# Patient Record
Sex: Male | Born: 1995 | Race: Black or African American | Hispanic: No | Marital: Single
Health system: Southern US, Community
[De-identification: ages and names within clinical notes are randomized; demographics above are authoritative.]

## PROBLEM LIST (undated history)

## (undated) DIAGNOSIS — S43006A Unspecified dislocation of unspecified shoulder joint, initial encounter: Secondary | ICD-10-CM

## (undated) DIAGNOSIS — G43909 Migraine, unspecified, not intractable, without status migrainosus: Secondary | ICD-10-CM

---
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
## 2017-06-09 ENCOUNTER — Emergency Department (HOSPITAL_COMMUNITY)
Admission: EM | Admit: 2017-06-09 | Discharge: 2017-06-09 | Disposition: A | Discharge status: Home / Self Care | Payer: Medicaid Other | Attending: Emergency Medicine | Admitting: Emergency Medicine

## 2017-06-09 ENCOUNTER — Encounter (HOSPITAL_COMMUNITY): Payer: Self-pay

## 2017-06-09 DIAGNOSIS — Z5321 Procedure and treatment not carried out due to patient leaving prior to being seen by health care provider: Secondary | ICD-10-CM | POA: Diagnosis not present

## 2017-06-09 DIAGNOSIS — R51 Headache: Secondary | ICD-10-CM | POA: Diagnosis present

## 2017-06-09 DIAGNOSIS — R109 Unspecified abdominal pain: Secondary | ICD-10-CM | POA: Insufficient documentation

## 2017-06-09 LAB — COMPREHENSIVE METABOLIC PANEL
ALBUMIN: 3.9 g/dL (ref 3.5–5.0)
ALT: 15 U/L — ABNORMAL LOW (ref 17–63)
ANION GAP: 6 (ref 5–15)
AST: 23 U/L (ref 15–41)
Alkaline Phosphatase: 70 U/L (ref 38–126)
BUN: 8 mg/dL (ref 6–20)
CALCIUM: 8.8 mg/dL — AB (ref 8.9–10.3)
CO2: 28 mmol/L (ref 22–32)
Chloride: 103 mmol/L (ref 101–111)
Creatinine, Ser: 1.04 mg/dL (ref 0.61–1.24)
GFR calc Af Amer: >60 mL/min (ref >60)
GLUCOSE: 90 mg/dL (ref 65–99)
Potassium: 3.8 mmol/L (ref 3.5–5.1)
SODIUM: 137 mmol/L (ref 135–145)
TOTAL PROTEIN: 6.9 g/dL (ref 6.5–8.1)
Total Bilirubin: 0.8 mg/dL (ref 0.3–1.2)

## 2017-06-09 LAB — URINALYSIS, ROUTINE W REFLEX MICROSCOPIC
Bilirubin Urine: NEGATIVE
GLUCOSE, UA: NEGATIVE mg/dL
HGB URINE DIPSTICK: NEGATIVE
Ketones, ur: NEGATIVE mg/dL
NITRITE: NEGATIVE
PH: 8 (ref 5.0–8.0)
Protein, ur: NEGATIVE mg/dL
SPECIFIC GRAVITY, URINE: 1.015 (ref 1.005–1.030)
Squamous Epithelial / LPF: NONE SEEN

## 2017-06-09 LAB — CBC
HCT: 44 % (ref 39.0–52.0)
Hemoglobin: 14.8 g/dL (ref 13.0–17.0)
MCH: 31.9 pg (ref 26.0–34.0)
MCHC: 33.6 g/dL (ref 30.0–36.0)
MCV: 94.8 fL (ref 78.0–100.0)
Platelets: 182 10*3/uL (ref 150–400)
RBC: 4.64 MIL/uL (ref 4.22–5.81)
RDW: 12.6 % (ref 11.5–15.5)
WBC: 4.3 10*3/uL (ref 4.0–10.5)

## 2017-06-09 LAB — LIPASE, BLOOD: Lipase: 25 U/L (ref 11–51)

## 2017-06-09 NOTE — ED Notes (Signed)
No answer in waiting.  

## 2017-06-09 NOTE — ED Triage Notes (Signed)
Pt states he started having severe migraine today with sharp abdominal pains; pt states he has been nausea but did not vomit; pt has hx of migraines; pt a&ox 4 on arrival.-Monique,RN

## 2017-10-10 ENCOUNTER — Encounter: Payer: Self-pay | Admitting: *Deleted

## 2017-10-10 ENCOUNTER — Emergency Department
Admission: EM | Admit: 2017-10-10 | Discharge: 2017-10-11 | Disposition: A | Discharge status: Home / Self Care | Payer: Medicaid Other | Attending: Emergency Medicine | Admitting: Emergency Medicine

## 2017-10-10 ENCOUNTER — Other Ambulatory Visit: Payer: Self-pay

## 2017-10-10 DIAGNOSIS — Z7722 Contact with and (suspected) exposure to environmental tobacco smoke (acute) (chronic): Secondary | ICD-10-CM | POA: Insufficient documentation

## 2017-10-10 DIAGNOSIS — R55 Syncope and collapse: Secondary | ICD-10-CM | POA: Insufficient documentation

## 2017-10-10 DIAGNOSIS — Z79899 Other long term (current) drug therapy: Secondary | ICD-10-CM | POA: Insufficient documentation

## 2017-10-10 DIAGNOSIS — E162 Hypoglycemia, unspecified: Secondary | ICD-10-CM | POA: Insufficient documentation

## 2017-10-10 DIAGNOSIS — E860 Dehydration: Secondary | ICD-10-CM | POA: Insufficient documentation

## 2017-10-10 HISTORY — DX: Unspecified dislocation of unspecified shoulder joint, initial encounter: S43.006A

## 2017-10-10 LAB — GLUCOSE, CAPILLARY: GLUCOSE-CAPILLARY: 67 mg/dL (ref 65–99)

## 2017-10-10 MED ORDER — SODIUM CHLORIDE 0.9 % IV BOLUS (SEPSIS)
1000.0000 mL | Freq: Once | INTRAVENOUS | Status: AC
  Administered 2017-10-10: 1000 mL via INTRAVENOUS

## 2017-10-10 NOTE — ED Provider Notes (Signed)
[facility] Emergency Department Provider Note   ____________________________________________   First MD Initiated Contact with Patient 10/10/17 2326     (approximate)  I have reviewed the triage vital signs and the nursing notes.   HISTORY  Chief Complaint Loss of Consciousness    HPI Troy Marshall is a 22 y.o. male who comes into the hospital today with a syncopal episode.  The patient states that he worked his first day at a new job today.  The patient did not have a ride home so he walked for 6 hours from his new job to his home this evening.  When he arrived home he told his significant other that his entire body was aching and he passed out.  He states that he did not have a ride home from work.  The patient also has not eaten anything all day.  He reports that he was rushing to get to work so he did not eat before he left home when he did not have the funds to purchase anything to eat while at work.  The patient states that he did drink 2 cups of water while at work.  The patient is unsure how long he was unconscious but his mom states that he was in and out.  He states that he does not believe he hit his head.  He states he has pain from the waist down and rates his pain a 10 out of 10 in intensity.  The patient is here today for evaluation.   Past Medical History:  Diagnosis Date  . Migraines   . Shoulder dislocation    left    Patient Active Problem List   Diagnosis Date Noted  . Migraine with aura and with status migrainosus, not intractable 08/09/2015  . Migraine without aura and without status migrainosus, not intractable 08/07/2015  . Complicated migraine 08/07/2015  . Cephalalgia 07/31/2015  . Abnormal neurological exam 07/31/2015  . Acne 05/25/2013    History reviewed. No pertinent surgical history.  Prior to Admission medications   Medication Sig Start Date End Date Taking? Authorizing Provider  topiramate (TOPAMAX) 25 MG tablet  Take 2 tablets at nighttime 11/27/15   Deetta Perla, MD    Allergies Patient has no known allergies.  Family History  Problem Relation Age of Onset  . Hypertension Other     Social History Social History   Tobacco Use  . Smoking status: Passive Smoke Exposure - Never Smoker  . Smokeless tobacco: Never Used  Substance Use Topics  . Alcohol use: No    Alcohol/week: 0.0 oz  . Drug use: No    Review of Systems  Constitutional: No fever/chills Eyes: No visual changes. ENT: No sore throat. Cardiovascular: Denies chest pain. Respiratory: Denies shortness of breath. Gastrointestinal: No abdominal pain.  No nausea, no vomiting.  No diarrhea.  No constipation. Genitourinary: Negative for dysuria. Musculoskeletal: Leg cramps Skin: Negative for rash. Neurological: Syncope   ____________________________________________   PHYSICAL EXAM:  VITAL SIGNS: ED Triage Vitals  Enc Vitals Group     BP 10/10/17 2303 118/76     Pulse Rate 10/10/17 2303 84     Resp 10/10/17 2303 16     Temp 10/10/17 2303 98.4 F (36.9 C)     Temp Source 10/10/17 2303 Oral     SpO2 10/10/17 2303 97 %     Weight 10/10/17 2304 123 lb 3.8 oz (55.9 kg)     Height 10/10/17 2304 5\' 6"  (1.676  m)     Head Circumference --      Peak Flow --      Pain Score 10/10/17 2302 10     Pain Loc --      Pain Edu? --      Excl. in GC? --     Constitutional: Alert and oriented. Well appearing and in moderate distress. Eyes: Conjunctivae are normal. PERRL. EOMI. Head: Atraumatic. Nose: No congestion/rhinnorhea. Mouth/Throat: Mucous membranes are moist.  Oropharynx non-erythematous. Neck: No cervical spine tenderness to palpation Cardiovascular: Normal rate, regular rhythm. Grossly normal heart sounds.  Good peripheral circulation. Respiratory: Normal respiratory effort.  No retractions. Lungs CTAB. Gastrointestinal: Soft and nontender. No distention. Positive bowel sounds Musculoskeletal: No lower extremity  tenderness nor edema.   Neurologic:  Normal speech and language.  Cranial nerves II through XII are grossly intact with no focal motor neuro deficit Skin:  Skin is warm, dry and intact.  Psychiatric: Mood and affect are normal.   ____________________________________________   LABS (all labs ordered are listed, but only abnormal results are displayed)  Labs Reviewed  URINALYSIS, COMPLETE (UACMP) WITH MICROSCOPIC - Abnormal; Notable for the following components:      Result Value   Color, Urine YELLOW (*)    APPearance CLOUDY (*)    Ketones, ur 20 (*)    Bacteria, UA RARE (*)    Squamous Epithelial / LPF 0-5 (*)    All other components within normal limits  BASIC METABOLIC PANEL  CBC  CK  TROPONIN I  GLUCOSE, CAPILLARY  CBG MONITORING, ED   ____________________________________________  EKG  ED ECG REPORT I, Rebecka Apley, the attending physician, personally viewed and interpreted this ECG.   Date: 10/10/2017  EKG Time: 2304  Rate: 85  Rhythm: normal sinus rhythm  Axis: normal  Intervals:none  ST&T Change: none  ____________________________________________  RADIOLOGY  ED MD interpretation:  none  Official radiology report(s): No results found.  ____________________________________________   PROCEDURES  Procedure(s) performed: None  Procedures  Critical Care performed: No  ____________________________________________   INITIAL IMPRESSION / ASSESSMENT AND PLAN / ED COURSE  As part of my medical decision making, I reviewed the following data within the electronic MEDICAL RECORD NUMBER Notes from prior ED visits and [place] Controlled Substance Database   This is a 22 year old male who comes into the hospital today after a syncopal episode.  The patient had not eaten all day and then walked 6 hours home from work.  My differential diagnosis includes dehydration, hypoglycemia, orthostatic syncope, exhaustion.  I did check some blood work to include a CBC  BMP glucose troponin and CK and urinalysis.  The patient's urine does show 20 ketones.  His CBC is unremarkable and his CK and troponin are both negative.  At this time I am still waiting for the results of the BMP.  I did give the patient a liter of normal saline.  The patient's glucose when he arrived was 67.  We did have him eat and drink here in the emergency department.     Patient's blood work is unremarkable.  He will be discharged home and encouraged to make sure he is eating and drinking adequately on a daily basis.  The patient should follow-up with the acute care clinic. ____________________________________________   FINAL CLINICAL IMPRESSION(S) / ED DIAGNOSES  Final diagnoses:  Syncope, unspecified syncope type  Hypoglycemia  Dehydration     ED Discharge Orders    None  Note:  This document was prepared using Dragon voice recognition software and may include unintentional dictation errors.    Rebecka ApleyWebster, Allison P, MD 10/11/17 [phone]

## 2017-10-10 NOTE — ED Triage Notes (Signed)
Per EMS report, patient worked at [employer] in [place]. Patient's fiancee told EMS that the patient then "collasped  And passed out, " for an unknown duration. Patient is alert and oriented upon arrival. Patient told EMS that he last ate last night, but tried to drink water today. Patient c/o bilateral calf/ankle pain.

## 2017-10-11 LAB — URINALYSIS, COMPLETE (UACMP) WITH MICROSCOPIC
Bilirubin Urine: NEGATIVE
Glucose, UA: NEGATIVE mg/dL
Hgb urine dipstick: NEGATIVE
Ketones, ur: 20 mg/dL — AB
LEUKOCYTES UA: NEGATIVE
Nitrite: NEGATIVE
PH: 7 (ref 5.0–8.0)
Protein, ur: NEGATIVE mg/dL
SPECIFIC GRAVITY, URINE: 1.014 (ref 1.005–1.030)

## 2017-10-11 LAB — TROPONIN I

## 2017-10-11 LAB — BASIC METABOLIC PANEL
Anion gap: 12 (ref 5–15)
BUN: 12 mg/dL (ref 6–20)
CHLORIDE: 103 mmol/L (ref 101–111)
CO2: 22 mmol/L (ref 22–32)
CREATININE: 0.92 mg/dL (ref 0.61–1.24)
Calcium: 9 mg/dL (ref 8.9–10.3)
GFR calc Af Amer: >60 mL/min (ref >60)
GFR calc non Af Amer: >60 mL/min (ref >60)
GLUCOSE: 76 mg/dL (ref 65–99)
Potassium: 3.9 mmol/L (ref 3.5–5.1)
SODIUM: 137 mmol/L (ref 135–145)

## 2017-10-11 LAB — CBC
HCT: 46.9 % (ref 40.0–52.0)
Hemoglobin: 15.4 g/dL (ref 13.0–18.0)
MCH: 32.4 pg (ref 26.0–34.0)
MCHC: 32.9 g/dL (ref 32.0–36.0)
MCV: 98.4 fL (ref 80.0–100.0)
PLATELETS: 174 10*3/uL (ref 150–440)
RBC: 4.76 MIL/uL (ref 4.40–5.90)
RDW: 13.4 % (ref 11.5–14.5)
WBC: 8.7 10*3/uL (ref 3.8–10.6)

## 2017-10-11 LAB — CK: CK TOTAL: 287 U/L (ref 49–397)

## 2017-10-11 NOTE — Discharge Instructions (Signed)
Please ensure that you are eating daily and drinking fluids daily.  Please do not severely exerts herself without eating or drinking.  I feel that you passed out today because you had not eaten and then exerted yourself by walking for 6 hours.  Please follow-up with your primary care physician or with the acute scare clinic.

## 2019-03-07 ENCOUNTER — Emergency Department (HOSPITAL_COMMUNITY): Payer: Self-pay

## 2019-03-07 ENCOUNTER — Other Ambulatory Visit: Payer: Self-pay

## 2019-03-07 ENCOUNTER — Emergency Department (HOSPITAL_COMMUNITY)
Admission: EM | Admit: 2019-03-07 | Discharge: 2019-03-07 | Disposition: A | Discharge status: Home / Self Care | Payer: Self-pay | Attending: Emergency Medicine | Admitting: Emergency Medicine

## 2019-03-07 ENCOUNTER — Encounter (HOSPITAL_COMMUNITY): Payer: Self-pay

## 2019-03-07 DIAGNOSIS — Z7722 Contact with and (suspected) exposure to environmental tobacco smoke (acute) (chronic): Secondary | ICD-10-CM | POA: Insufficient documentation

## 2019-03-07 DIAGNOSIS — R109 Unspecified abdominal pain: Secondary | ICD-10-CM | POA: Insufficient documentation

## 2019-03-07 DIAGNOSIS — R369 Urethral discharge, unspecified: Secondary | ICD-10-CM | POA: Insufficient documentation

## 2019-03-07 DIAGNOSIS — R310 Gross hematuria: Secondary | ICD-10-CM | POA: Insufficient documentation

## 2019-03-07 LAB — URINALYSIS, ROUTINE W REFLEX MICROSCOPIC
Bacteria, UA: NONE SEEN
Bilirubin Urine: NEGATIVE
Glucose, UA: NEGATIVE mg/dL
Ketones, ur: 5 mg/dL — AB
Nitrite: NEGATIVE
Protein, ur: 100 mg/dL — AB
Specific Gravity, Urine: 1.024 (ref 1.005–1.030)
WBC, UA: >50 WBC/hpf — ABNORMAL HIGH (ref 0–5)
pH: 6 (ref 5.0–8.0)

## 2019-03-07 MED ORDER — AZITHROMYCIN 250 MG PO TABS: 1000.0000 mg | ORAL_TABLET | Freq: Once | ORAL | Status: DC

## 2019-03-07 MED ORDER — DOXYCYCLINE HYCLATE 100 MG PO CAPS: 100.0000 mg | ORAL_CAPSULE | Freq: Two times a day (BID) | ORAL | 0 refills | Status: DC

## 2019-03-07 MED ORDER — CEFTRIAXONE SODIUM 250 MG IJ SOLR: 250.0000 mg | Freq: Once | INTRAMUSCULAR | Status: DC

## 2019-03-07 NOTE — ED Notes (Signed)
Patient verbalizes understanding of discharge instructions. Opportunity for questioning and answers were provided. Pt discharged from ED. 

## 2019-03-07 NOTE — ED Provider Notes (Signed)
[facility] EMERGENCY DEPARTMENT Provider Note   CSN: [id number] Arrival date & time: 03/07/19  0944     History   Chief Complaint Chief Complaint  Patient presents with  . Hematuria    HPI Troy Marshall is a 23 y.o. male.     The history is provided by the patient. No language interpreter was used.  Hematuria     23 year old male presenting with concerns of bloody urine.  Patient states he went to urinate today and after urination he noticed blood in the toilet bowl.  He denies any pain when that happened.  He denies burning sensation when urinate.  Aside from some mild right flank pain which has been happening intermittently for the past 2 days, he denies any other pain.  States he has not had sexual intercourse the past 2 months.  Denies any prior history of kidney stones.  Denies nausea or vomiting abdominal pain testicle pain.  He does work he has a job require physical work but denies any strenuous activities is more than usual.  No specific treatment tried.     Past Medical History:  Diagnosis Date  . Migraines   . Shoulder dislocation    left    Patient Active Problem List   Diagnosis Date Noted  . Migraine with aura and with status migrainosus, not intractable 08/09/2015  . Migraine without aura and without status migrainosus, not intractable 08/07/2015  . Complicated migraine 70/35/0093  . Cephalalgia 07/31/2015  . Abnormal neurological exam 07/31/2015  . Acne 05/25/2013    No past surgical history on file.      Home Medications    Prior to Admission medications   Medication Sig Start Date End Date Taking? Authorizing Provider  topiramate (TOPAMAX) 25 MG tablet Take 2 tablets at nighttime Patient not taking: Reported on 03/07/2019 11/27/15   Jodi Geralds, MD    Family History Family History  Problem Relation Age of Onset  . Hypertension Other     Social History Social History   Tobacco Use  . Smoking status: Passive  Smoke Exposure - Never Smoker  . Smokeless tobacco: Never Used  Substance Use Topics  . Alcohol use: No    Alcohol/week: 0.0 standard drinks  . Drug use: No     Allergies   Patient has no known allergies.   Review of Systems Review of Systems  Constitutional: Negative for fever.  Genitourinary: Positive for hematuria.     Physical Exam Updated Vital Signs BP 106/63 (BP Location: Right Arm)   Pulse 61   Temp 98.2 F (36.8 C) (Oral)   Resp 17   SpO2 98%   Physical Exam Vitals signs and nursing note reviewed.  Constitutional:      General: He is not in acute distress.    Appearance: He is well-developed.  HENT:     Head: Atraumatic.  Eyes:     Conjunctiva/sclera: Conjunctivae normal.  Neck:     Musculoskeletal: Neck supple.  Abdominal:     Palpations: Abdomen is soft.     Tenderness: There is no abdominal tenderness. There is no right CVA tenderness or left CVA tenderness.  Genitourinary:    Comments: Chaperone present during exam.  No inguinal lymphadenopathy or inguinal hernia noted.  Normal uncircumcised penis free of lesion or rash.  Copious amount of discharge noted care urethral meatus.  No obvious blood noted.  Testicle with normalized and nontender.  Normal scrotum. Skin:    Findings: No  rash.  Neurological:     Mental Status: He is alert.      ED Treatments / Results  Labs (all labs ordered are listed, but only abnormal results are displayed) Labs Reviewed  URINALYSIS, ROUTINE W REFLEX MICROSCOPIC - Abnormal; Notable for the following components:      Result Value   APPearance CLOUDY (*)    Hgb urine dipstick LARGE (*)    Ketones, ur 5 (*)    Protein, ur 100 (*)    Leukocytes,Ua MODERATE (*)    WBC, UA >50 (*)    All other components within normal limits  RPR  HIV ANTIBODY (ROUTINE TESTING W REFLEX)  GC/CHLAMYDIA PROBE AMP ([place]) NOT AT [facility]    EKG None  Radiology Ct Renal Stone Study  Result Date: 03/07/2019 CLINICAL DATA:   Gross hematuria. Suspected stone disease. EXAM: CT ABDOMEN AND PELVIS WITHOUT CONTRAST TECHNIQUE: Multidetector CT imaging of the abdomen and pelvis was performed following the standard protocol without IV contrast. COMPARISON:  None. FINDINGS: Lower chest: No acute findings. Hepatobiliary: No mass visualized on this unenhanced exam. Gallbladder is unremarkable. Pancreas: No mass or inflammatory process visualized on this unenhanced exam. Spleen:  Within normal limits in size. Adrenals/Urinary tract: No evidence of urolithiasis or hydronephrosis. Unremarkable unopacified urinary bladder. Stomach/Bowel: No evidence of obstruction, inflammatory process, or abnormal fluid collections. Vascular/Lymphatic: No pathologically enlarged lymph nodes identified. No evidence of abdominal aortic aneurysm. Reproductive:  No mass or other significant abnormality. Other:  None. Musculoskeletal:  No suspicious bone lesions identified. IMPRESSION: Unremarkable. No evidence of urolithiasis, hydronephrosis, or other acute findings. Electronically Signed   By: Danae OrleansJohn A Stahl M.D.   On: 03/07/2019 11:48    Procedures Procedures (including critical care time)  Medications Ordered in ED Medications - No data to display   Initial Impression / Assessment and Plan / ED Course  I have reviewed the triage vital signs and the nursing notes.  Pertinent labs & imaging results that were available during my care of the patient were reviewed by me and considered in my medical decision making (see chart for details).        BP 106/63 (BP Location: Right Arm)   Pulse 61   Temp 98.2 F (36.8 C) (Oral)   Resp 17   SpO2 98%    Final Clinical Impressions(s) / ED Diagnoses   Final diagnoses:  Gross hematuria  Abnormal penile discharge, with blood    ED Discharge Orders         Ordered    doxycycline (VIBRAMYCIN) 100 MG capsule  2 times daily     03/07/19 1230         10:27 AM Patient report seeing blood when  urinating today.  Denies any significant pain aside from occasional right flank pain.  No CVA tenderness on exam.  On examination of his genitalia, patient has copious discharge noted at the meatus but no obvious blood.  12:28 PM UA shows large hemoglobin and urine dipsticks with moderate leukocyte esterase and greater than 50 WBC.  A renal stone study CT scan was obtained without any acute finding.  Patient will be treated for urinary tract infection as well as potential STI.   Fayrene Helperran, Corrin Hingle, PA-C 03/07/19 1231    Gerhard MunchLockwood, Robert, MD 03/07/19 1320

## 2019-03-07 NOTE — ED Triage Notes (Signed)
Pt stated that while he was using restroom, he noticed blood in urine; pt states that he saw blood at tip of penis; pt denies any burning or irritation; pt states that he hasn't had sex in two months

## 2019-03-07 NOTE — ED Notes (Signed)
Culture sent in addition to UA 

## 2019-03-07 NOTE — Discharge Instructions (Signed)
You have been evaluated for your condition.  You may have a urinary tract infection.  Take antibiotic as prescribed.

## 2019-03-08 LAB — HIV ANTIBODY (ROUTINE TESTING W REFLEX): HIV Screen 4th Generation wRfx: NONREACTIVE

## 2019-03-08 LAB — GC/CHLAMYDIA PROBE AMP (~~LOC~~) NOT AT ARMC
Chlamydia: POSITIVE — AB
Neisseria Gonorrhea: POSITIVE — AB

## 2019-03-08 LAB — RPR: RPR Ser Ql: NONREACTIVE

## 2019-03-09 ENCOUNTER — Ambulatory Visit: Payer: Self-pay | Admitting: Physician Assistant

## 2019-03-09 ENCOUNTER — Other Ambulatory Visit: Payer: Self-pay

## 2019-03-09 ENCOUNTER — Encounter: Payer: Self-pay | Admitting: Physician Assistant

## 2019-03-09 DIAGNOSIS — Z113 Encounter for screening for infections with a predominantly sexual mode of transmission: Secondary | ICD-10-CM

## 2019-03-09 DIAGNOSIS — A5401 Gonococcal cystitis and urethritis, unspecified: Secondary | ICD-10-CM

## 2019-03-09 DIAGNOSIS — A5601 Chlamydial cystitis and urethritis: Secondary | ICD-10-CM

## 2019-03-09 MED ORDER — CEFTRIAXONE SODIUM 250 MG IJ SOLR
250.0000 mg | Freq: Once | INTRAMUSCULAR | Status: AC
Start: 2019-03-09 — End: 2019-03-09
  Administered 2019-03-09: 250 mg via INTRAMUSCULAR

## 2019-03-09 MED ORDER — AZITHROMYCIN 500 MG PO TABS: ORAL_TABLET | ORAL | 0 refills | Status: DC

## 2019-03-09 NOTE — Progress Notes (Signed)
    STI clinic/screening visit  Subjective:  Troy Marshall is a 23 y.o. male being seen today for an STI screening visit. The patient reports they do have symptoms.  Patient has the following medical conditionshas Acne; Cephalalgia; Abnormal neurological exam; Migraine without aura and without status migrainosus, not intractable; Complicated migraine; and Migraine with aura and with status migrainosus, not intractable on their problem list.  Chief Complaint  Patient presents with  . SEXUALLY TRANSMITTED DISEASE    Patient reports that he woke with blood in his urine a couple of days ago and was seen in the ER.  States that he was called on 03/08/19, and told that he had tests that were positive for GC and Chlamydia.  States he was told to come here for treatment. HPI   See flowsheet for further details and programmatic requirements.    The following portions of the patient's history were reviewed and updated as appropriate: allergies, current medications, past family history, past medical history, past social history, past surgical history and problem list. Problem list updated.  Objective:  There were no vitals filed for this visit.  Physical Exam Constitutional:      General: He is not in acute distress.    Appearance: Normal appearance.  HENT:     Head: Normocephalic.  Neurological:     Mental Status: He is alert and oriented to person, place, and time.  Psychiatric:        Mood and Affect: Mood normal.        Behavior: Behavior normal.        Thought Content: Thought content normal.        Judgment: Judgment normal.   Screening exam deferred today.  Reviewed record from ER visit and patient had exam there on 03/07/19.    Assessment and Plan:  Troy Marshall is a 23 y.o. male presenting to the [facility] Department for STD screening and treatment  1. Screening for STD (sexually transmitted disease) Reviewed with patient, s/s and required questions  today. Reveiwed ER visit and per results he tested positive for both GC and Chlamydia. Rec condoms with all sex  2. 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 Chlamydial urethritis in male Treated per above for GC and Chlamydia.     No follow-ups on file.  No future appointments.  Jerene Dilling, PA

## 2019-09-11 ENCOUNTER — Ambulatory Visit (HOSPITAL_COMMUNITY)
Admission: EM | Admit: 2019-09-11 | Discharge: 2019-09-11 | Disposition: A | Discharge status: Home / Self Care | Payer: Medicaid Other | Attending: Physician Assistant | Admitting: Physician Assistant

## 2019-09-11 ENCOUNTER — Encounter (HOSPITAL_COMMUNITY): Payer: Self-pay

## 2019-09-11 DIAGNOSIS — Z20822 Contact with and (suspected) exposure to covid-19: Secondary | ICD-10-CM | POA: Insufficient documentation

## 2019-09-11 NOTE — Discharge Instructions (Signed)
If your Covid-19 test is positive, you will receive a phone call from [facility] regarding your results. Negative test results are not called. Both positive and negative results area always visible on MyChart. If you do not have a MyChart account, sign up instructions are in your discharge papers.   Persons who are directed to care for themselves at home may discontinue isolation under the following conditions:   At least 10 days have passed since symptom onset and  At least 24 hours have passed without running a fever (this means without the use of fever-reducing medications) and  Other symptoms have improved.  Persons infected with COVID-19 who never develop symptoms may discontinue isolation and other precautions 10 days after the date of their first positive COVID-19 test.  

## 2019-09-11 NOTE — ED Provider Notes (Signed)
[facility]    CSN: [id number] Arrival date & time: 09/11/19  1824      History   Chief Complaint Chief Complaint  Patient presents with  . COVID test    HPI Troy Marshall is a 24 y.o. male.   Patient reports to urgent care today for covid testing due to exposure at work. He denies all symptoms currently. His work is requiring him be tested. This exposure was frequent at work and unpredictable.  Denies cough, congestion, shortness of breath, headache, nausea, vomiting, diarrhea, sore throat.      Past Medical History:  Diagnosis Date  . Migraines   . Shoulder dislocation    left    Patient Active Problem List   Diagnosis Date Noted  . Migraine with aura and with status migrainosus, not intractable 08/09/2015  . Migraine without aura and without status migrainosus, not intractable 08/07/2015  . Complicated migraine 08/07/2015  . Cephalalgia 07/31/2015  . Abnormal neurological exam 07/31/2015  . Acne 05/25/2013    History reviewed. No pertinent surgical history.     Home Medications    Prior to Admission medications   Medication Sig Start Date End Date Taking? Authorizing Provider  azithromycin (ZITHROMAX) 500 MG tablet Take po DOT x 1 03/09/19   Hampton, Marylynn Pearson, [place]  doxycycline (VIBRAMYCIN) 100 MG capsule Take 1 capsule (100 mg total) by mouth 2 (two) times daily. One po bid x 7 days 03/07/19   Fayrene Helper, PA-C  topiramate (TOPAMAX) 25 MG tablet Take 2 tablets at nighttime Patient not taking: Reported on 03/07/2019 11/27/15   Deetta Perla, MD    Family History Family History  Problem Relation Age of Onset  . Hypertension Other     Social History Social History   Tobacco Use  . Smoking status: Passive Smoke Exposure - Never Smoker  . Smokeless tobacco: Never Used  Substance Use Topics  . Alcohol use: No    Alcohol/week: 0.0 standard drinks  . Drug use: No     Allergies   Patient has no known allergies.   Review of Systems  Review of Systems  Constitutional: Negative for chills and fever.  HENT: Negative for congestion, ear pain, sinus pressure, sinus pain and sore throat.   Eyes: Negative for pain and visual disturbance.  Respiratory: Negative for cough and shortness of breath.   Cardiovascular: Negative for chest pain and palpitations.  Gastrointestinal: Negative for abdominal pain and vomiting.  Genitourinary: Negative for dysuria and hematuria.  Musculoskeletal: Negative for arthralgias, back pain and myalgias.  Skin: Negative for color change and rash.  Neurological: Negative for seizures, syncope and headaches.  All other systems reviewed and are negative.    Physical Exam Triage Vital Signs ED Triage Vitals  Enc Vitals Group     BP      Pulse      Resp      Temp      Temp src      SpO2      Weight      Height      Head Circumference      Peak Flow      Pain Score      Pain Loc      Pain Edu?      Excl. in GC?    No data found.  Updated Vital Signs BP 110/75 (BP Location: Right Arm)   Pulse 73   Temp 98.5 F (36.9 C) (Oral)   Resp 15  SpO2 100%   Visual Acuity Right Eye Distance:   Left Eye Distance:   Bilateral Distance:    Right Eye Near:   Left Eye Near:    Bilateral Near:     Physical Exam Vitals and nursing note reviewed.  Constitutional:      General: He is not in acute distress.    Appearance: He is well-developed. He is not ill-appearing.  HENT:     Head: Normocephalic and atraumatic.  Eyes:     Conjunctiva/sclera: Conjunctivae normal.     Pupils: Pupils are equal, round, and reactive to light.  Cardiovascular:     Rate and Rhythm: Normal rate and regular rhythm.     Heart sounds: No murmur.  Pulmonary:     Effort: Pulmonary effort is normal. No respiratory distress.     Breath sounds: Normal breath sounds. No wheezing, rhonchi or rales.  Musculoskeletal:     Cervical back: Normal range of motion.     Right lower leg: No edema.     Left lower leg:  No edema.  Skin:    General: Skin is warm and dry.  Neurological:     General: No focal deficit present.     Mental Status: He is alert and oriented to person, place, and time.  Psychiatric:        Mood and Affect: Mood normal.        Behavior: Behavior normal.        Thought Content: Thought content normal.        Judgment: Judgment normal.      UC Treatments / Results  Labs (all labs ordered are listed, but only abnormal results are displayed) Labs Reviewed  NOVEL CORONAVIRUS, NAA (HOSP ORDER, SEND-OUT TO REF LAB; TAT 18-24 HRS)    EKG   Radiology No results found.  Procedures Procedures (including critical care time)  Medications Ordered in UC Medications - No data to display  Initial Impression / Assessment and Plan / UC Course  I have reviewed the triage vital signs and the nursing notes.  Pertinent labs & imaging results that were available during my care of the patient were reviewed by me and considered in my medical decision making (see chart for details).     #Covid Exposure - Asymptomatic. COVID PCR sent. Precautions and guidelines discussed.    Final Clinical Impressions(s) / UC Diagnoses   Final diagnoses:  Close exposure to COVID-19 virus  Encounter for laboratory testing for COVID-19 virus     Discharge Instructions     If your Covid-19 test is positive, you will receive a phone call from [facility] regarding your results. Negative test results are not called. Both positive and negative results area always visible on MyChart. If you do not have a MyChart account, sign up instructions are in your discharge papers.   Persons who are directed to care for themselves at home may discontinue isolation under the following conditions:  . At least 10 days have passed since symptom onset and . At least 24 hours have passed without running a fever (this means without the use of fever-reducing medications) and . Other symptoms have improved.   Persons infected with COVID-19 who never develop symptoms may discontinue isolation and other precautions 10 days after the date of their first positive COVID-19 test.     ED Prescriptions    None     PDMP not reviewed this encounter.   Purnell Shoemaker, PA-C 09/11/19 2102

## 2019-09-11 NOTE — ED Triage Notes (Signed)
Pt presents to UC for COVID test . Pt denies any signs and symptoms.  

## 2019-09-14 ENCOUNTER — Telehealth: Payer: Self-pay | Admitting: Emergency Medicine

## 2019-09-14 LAB — NOVEL CORONAVIRUS, NAA (HOSP ORDER, SEND-OUT TO REF LAB; TAT 18-24 HRS): SARS-CoV-2, NAA: NOT DETECTED

## 2019-09-14 NOTE — Telephone Encounter (Signed)
Pt called to receive COVID results.  Returned called and verified identity using two identifiers.  Provided negative COVID results, and patient verbalized understanding.  Will come up to sign medical release form to print results to provide to employer.

## 2019-12-04 ENCOUNTER — Encounter (HOSPITAL_COMMUNITY): Payer: Self-pay

## 2019-12-04 ENCOUNTER — Other Ambulatory Visit: Payer: Self-pay

## 2019-12-04 ENCOUNTER — Ambulatory Visit (HOSPITAL_COMMUNITY)
Admission: EM | Admit: 2019-12-04 | Discharge: 2019-12-04 | Disposition: A | Discharge status: Home / Self Care | Payer: Medicaid Other | Attending: Family Medicine | Admitting: Family Medicine

## 2019-12-04 DIAGNOSIS — T7840XA Allergy, unspecified, initial encounter: Secondary | ICD-10-CM

## 2019-12-04 MED ORDER — PREDNISONE 10 MG (21) PO TBPK: ORAL_TABLET | ORAL | 0 refills | Status: DC

## 2019-12-04 NOTE — ED Provider Notes (Addendum)
[facility]    CSN: [id number] Arrival date & time: 12/04/19  3825      History   Chief Complaint Chief Complaint  Patient presents with  . Skin reaction (Scalp)    HPI Troy Marshall is a 24 y.o. male.   Patient is a 24 year old male presents today with redness, swelling and rash to scalp and facial area.  Symptoms have been constant and worsening since using a new hair product approximately 2 to 3 days ago.  He has had some mild itching.  Denies any history of the same.  Denies any fevers, drainage, oozing from the rash.  Has not tried anything for his symptoms.  ROS per HPI      Past Medical History:  Diagnosis Date  . Migraines   . Shoulder dislocation    left    Patient Active Problem List   Diagnosis Date Noted  . Migraine with aura and with status migrainosus, not intractable 08/09/2015  . Migraine without aura and without status migrainosus, not intractable 08/07/2015  . Complicated migraine 05/39/7673  . Cephalalgia 07/31/2015  . Abnormal neurological exam 07/31/2015  . Acne 05/25/2013    History reviewed. No pertinent surgical history.     Home Medications    Prior to Admission medications   Medication Sig Start Date End Date Taking? Authorizing Provider  predniSONE (STERAPRED UNI-PAK 21 TAB) 10 MG (21) TBPK tablet 6 tabs for 1 day, then 5 tabs for 1 das, then 4 tabs for 1 day, then 3 tabs for 1 day, 2 tabs for 1 day, then 1 tab for 1 day 12/04/19   Loura Halt A, NP  topiramate (TOPAMAX) 25 MG tablet Take 2 tablets at nighttime Patient not taking: Reported on 03/07/2019 11/27/15 12/04/19  Jodi Geralds, MD    Family History Family History  Problem Relation Age of Onset  . Hypertension Other     Social History Social History   Tobacco Use  . Smoking status: Passive Smoke Exposure - Never Smoker  . Smokeless tobacco: Never Used  Substance Use Topics  . Alcohol use: No    Alcohol/week: 0.0 standard drinks  . Drug use: No      Allergies   Patient has no known allergies.   Review of Systems Review of Systems   Physical Exam Triage Vital Signs ED Triage Vitals  Enc Vitals Group     BP 12/04/19 0924 113/75     Pulse Rate 12/04/19 0924 68     Resp 12/04/19 0924 18     Temp 12/04/19 0924 98.7 F (37.1 C)     Temp Source 12/04/19 0924 Oral     SpO2 12/04/19 0924 97 %     Weight --      Height --      Head Circumference --      Peak Flow --      Pain Score 12/04/19 0923 4     Pain Loc --      Pain Edu? --      Excl. in [place]? --    No data found.  Updated Vital Signs BP 113/75 (BP Location: Right Arm)   Pulse 68   Temp 98.7 F (37.1 C) (Oral)   Resp 18   SpO2 97%   Visual Acuity Right Eye Distance:   Left Eye Distance:   Bilateral Distance:    Right Eye Near:   Left Eye Near:    Bilateral Near:     Physical Exam Vitals  and nursing note reviewed.  Constitutional:      Appearance: Normal appearance.  HENT:     Head: Normocephalic and atraumatic.     Nose: Nose normal.  Eyes:     Conjunctiva/sclera: Conjunctivae normal.  Pulmonary:     Effort: Pulmonary effort is normal.  Musculoskeletal:        General: Normal range of motion.     Cervical back: Normal range of motion.  Skin:    General: Skin is warm and dry.     Findings: Rash present.     Comments: Erythematous raised macular rash to scalp and extending into facial area into eyebrows  Neurological:     Mental Status: He is alert.  Psychiatric:        Mood and Affect: Mood normal.      UC Treatments / Results  Labs (all labs ordered are listed, but only abnormal results are displayed) Labs Reviewed - No data to display  EKG   Radiology No results found.  Procedures Procedures (including critical care time)  Medications Ordered in UC Medications - No data to display  Initial Impression / Assessment and Plan / UC Course  I have reviewed the triage vital signs and the nursing notes.  Pertinent labs &  imaging results that were available during my care of the patient were reviewed by me and considered in my medical decision making (see chart for details).     Allergic reaction-most likely allergic reaction to hair product Based on the facial involvement we will go ahead and treat with prednisone taper over the next 6 days. Recommended he can do Benadryl as needed for itching Follow up as needed for continued or worsening symptoms  Final Clinical Impressions(s) / UC Diagnoses   Final diagnoses:  Allergic reaction, initial encounter     Discharge Instructions     Take the prednisone as prescribed for allergic reaction. Recommend not using any products on the scalp until fully healed. Follow up as needed for continued or worsening symptoms     ED Prescriptions    Medication Sig Dispense Auth. Provider   predniSONE (STERAPRED UNI-PAK 21 TAB) 10 MG (21) TBPK tablet 6 tabs for 1 day, then 5 tabs for 1 das, then 4 tabs for 1 day, then 3 tabs for 1 day, 2 tabs for 1 day, then 1 tab for 1 day 21 tablet Princesa Willig A, NP     PDMP not reviewed this encounter.        Janace Aris, NP 12/04/19 [phone]

## 2019-12-04 NOTE — ED Triage Notes (Signed)
Pt presents with skin (scalp) reaction and edges around face to hair care product he used a few days ago.

## 2019-12-04 NOTE — Discharge Instructions (Signed)
Take the prednisone as prescribed for allergic reaction. Recommend not using any products on the scalp until fully healed. Follow up as needed for continued or worsening symptoms

## 2019-12-16 ENCOUNTER — Other Ambulatory Visit: Payer: Self-pay

## 2019-12-16 ENCOUNTER — Encounter (HOSPITAL_COMMUNITY): Payer: Self-pay

## 2019-12-16 ENCOUNTER — Ambulatory Visit (HOSPITAL_COMMUNITY)
Admission: EM | Admit: 2019-12-16 | Discharge: 2019-12-16 | Disposition: A | Discharge status: Home / Self Care | Payer: Medicaid Other | Attending: Family Medicine | Admitting: Family Medicine

## 2019-12-16 DIAGNOSIS — G43809 Other migraine, not intractable, without status migrainosus: Secondary | ICD-10-CM

## 2019-12-16 NOTE — Discharge Instructions (Addendum)
Migraines: I would continue taking excedrin for your headaches.   I have given you a work note until Tuesday night. Follow up as needed.

## 2019-12-16 NOTE — ED Triage Notes (Signed)
Patient reports headache onset yesterday evening. Needs work note.

## 2019-12-18 NOTE — ED Provider Notes (Signed)
[facility]    CSN: [id number] Arrival date & time: 12/16/19  1648      History   Chief Complaint Chief Complaint  Patient presents with  . Headache    HPI Troy Marshall is a 24 y.o. male.   Patient reports he has been experiencing headache since yesterday.  He reports he has been taking Excedrin migraine.  He reports that his headache feels like it is almost gone.  Requesting a work note for having to leave work last night and not working today due to headache.  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  Denies sore throat, cough, shortness of breath, nausea, vomiting, diarrhea, rash, fever, other symptoms  ROS per HPI  The history is provided by the patient.  Headache   Past Medical History:  Diagnosis Date  . Migraines   . Shoulder dislocation    left    Patient Active Problem List   Diagnosis Date Noted  . Migraine with aura and with status migrainosus, not intractable 08/09/2015  . Migraine without aura and without status migrainosus, not intractable 08/07/2015  . Complicated migraine 08/07/2015  . Cephalalgia 07/31/2015  . Abnormal neurological exam 07/31/2015  . Acne 05/25/2013    History reviewed. No pertinent surgical history.     Home Medications    Prior to Admission medications   Medication Sig Start Date End Date Taking? Authorizing Provider  predniSONE (STERAPRED UNI-PAK 21 TAB) 10 MG (21) TBPK tablet 6 tabs for 1 day, then 5 tabs for 1 das, then 4 tabs for 1 day, then 3 tabs for 1 day, 2 tabs for 1 day, then 1 tab for 1 day 12/04/19   Dahlia Byes A, NP  topiramate (TOPAMAX) 25 MG tablet Take 2 tablets at nighttime Patient not taking: Reported on 03/07/2019 11/27/15 12/04/19  Deetta Perla, MD    Family History Family History  Problem Relation Age of Onset  . Hypertension Other     Social History Social History   Tobacco Use  . Smoking status: Passive Smoke Exposure - Never  Smoker  . Smokeless tobacco: Never Used  Substance Use Topics  . Alcohol use: No    Alcohol/week: 0.0 standard drinks  . Drug use: No     Allergies   Patient has no known allergies.   Review of Systems Review of Systems  Neurological: Positive for headaches.     Physical Exam Triage Vital Signs ED Triage Vitals  Enc Vitals Group     BP 12/16/19 1741 127/77     Pulse Rate 12/16/19 1741 68     Resp 12/16/19 1741 16     Temp 12/16/19 1741 98.1 F (36.7 C)     Temp Source 12/16/19 1741 Oral     SpO2 12/16/19 1741 100 %     Weight --      Height --      Head Circumference --      Peak Flow --      Pain Score 12/16/19 1740 4     Pain Loc --      Pain Edu? --      Excl. in [place]? --    No data found.  Updated Vital Signs BP 127/77 (BP Location: Right Arm)   Pulse 68   Temp 98.1 F (36.7 C) (Oral)   Resp 16   SpO2 100%   Visual Acuity Right Eye Distance:   Left Eye Distance:  Bilateral Distance:    Right Eye Near:   Left Eye Near:    Bilateral Near:     Physical Exam Vitals and nursing note reviewed.  Constitutional:      General: He is not in acute distress.    Appearance: He is well-developed and normal weight. He is not ill-appearing.  HENT:     Head: Normocephalic and atraumatic.     Mouth/Throat:     Mouth: Mucous membranes are moist.     Pharynx: Oropharynx is clear.  Eyes:     Extraocular Movements: Extraocular movements intact.     Conjunctiva/sclera: Conjunctivae normal.     Pupils: Pupils are equal, round, and reactive to light.  Cardiovascular:     Rate and Rhythm: Normal rate and regular rhythm.     Heart sounds: No murmur.  Pulmonary:     Effort: Pulmonary effort is normal. No respiratory distress.     Breath sounds: Normal breath sounds. No stridor. No wheezing, rhonchi or rales.  Chest:     Chest wall: No tenderness.  Abdominal:     General: Bowel sounds are normal.     Palpations: Abdomen is soft.     Tenderness: There is no  abdominal tenderness.  Musculoskeletal:        General: Normal range of motion.     Cervical back: Normal range of motion and neck supple.  Skin:    General: Skin is warm and dry.     Capillary Refill: Capillary refill takes less than 2 seconds.  Neurological:     Mental Status: He is alert and oriented to person, place, and time.  Psychiatric:        Mood and Affect: Mood normal.        Behavior: Behavior normal.      UC Treatments / Results  Labs (all labs ordered are listed, but only abnormal results are displayed) Labs Reviewed - No data to display  EKG   Radiology No results found.  Procedures Procedures (including critical care time)  Medications Ordered in UC Medications - No data to display  Initial Impression / Assessment and Plan / UC Course  I have reviewed the triage vital signs and the nursing notes.  Pertinent labs & imaging results that were available during my care of the patient were reviewed by me and considered in my medical decision making (see chart for details).    Migraine: Headache persisting for the last 36 hours.  Excedrin has helped relieve some pain, but has not resolved the photophobia.  Declines Toradol in office today.  Declines Topamax refill as well.  Work note provided stating that he may return in 2 days, sooner if he is feeling better.  Patient instructed to follow-up with primary care with this office as needed.  Patient instructed to follow-up the ER with worst headache of his life, other concerning symptoms.  Patient verbalized understanding is in agreement with treatment plan. Final Clinical Impressions(s) / UC Diagnoses   Final diagnoses:  Other migraine without status migrainosus, not intractable     Discharge Instructions     Migraines: I would continue taking excedrin for your headaches.   I have given you a work note until Tuesday night. Follow up as needed.     ED Prescriptions    None     PDMP not reviewed this  encounter.   Faustino Congress, NP 12/18/19 2302

## 2020-02-25 IMAGING — CT CT RENAL STONE PROTOCOL
2 of 4 series · 16 of 46 positions shown, 18 images · non-contrast
Comparison: None.

CLINICAL DATA: Gross hematuria. Suspected stone disease.

EXAM:
CT ABDOMEN AND PELVIS WITHOUT CONTRAST
TECHNIQUE: Multidetector CT imaging of the abdomen and pelvis was performed
following the standard protocol without IV contrast.

[Series 3: ap without · axial · non-contrast · 0.55mm/px · z∈[+913,+1253]mm · 13 of 78 slices shown, 15 images]
[im 5/78  soft-tissue]
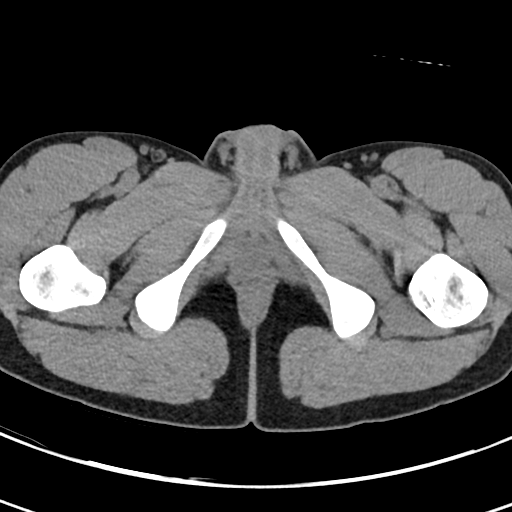
[im 5/78  bone]
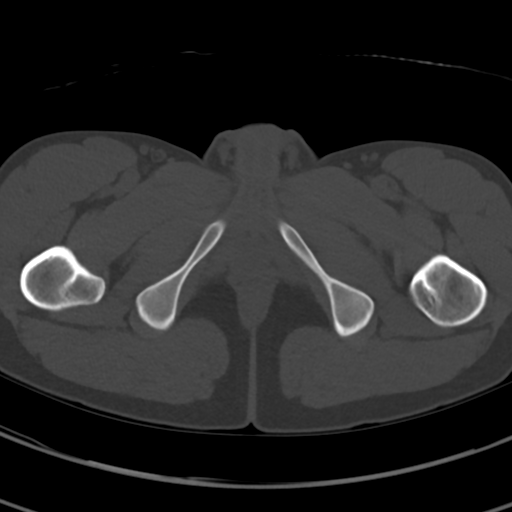
[im 10/78  soft-tissue]
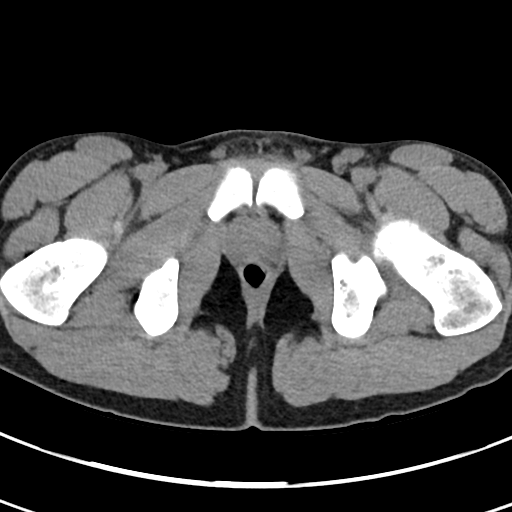
[im 19/78  soft-tissue]
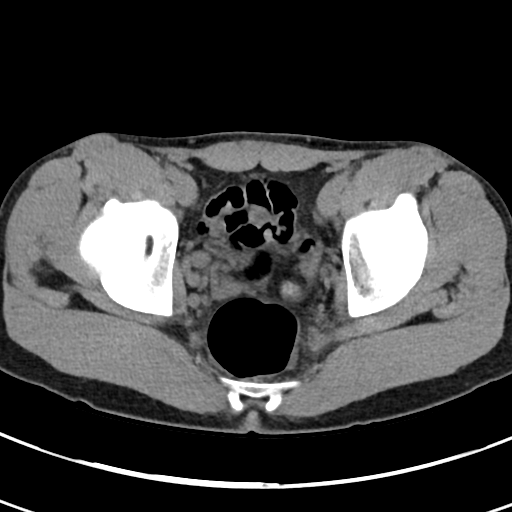
[im 23/78  soft-tissue]
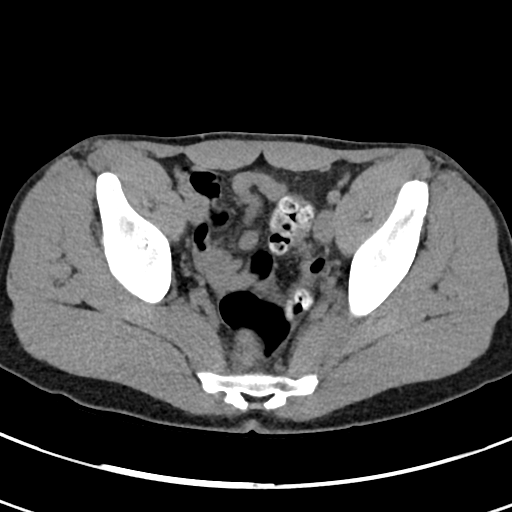
[im 28/78  soft-tissue]
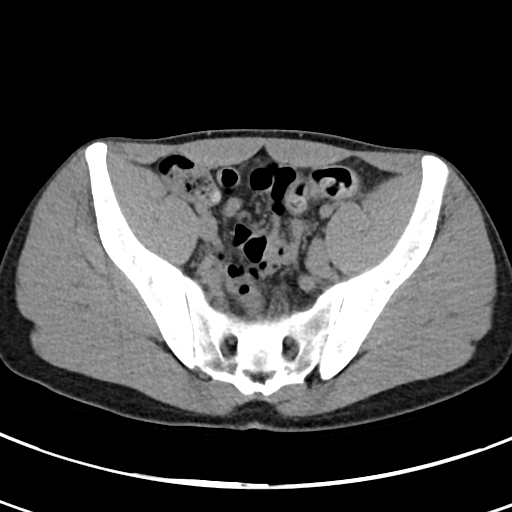
[im 32/78  soft-tissue]
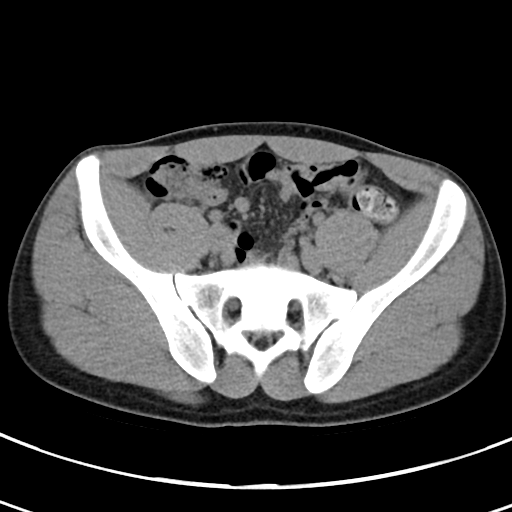
[im 41/78  soft-tissue]
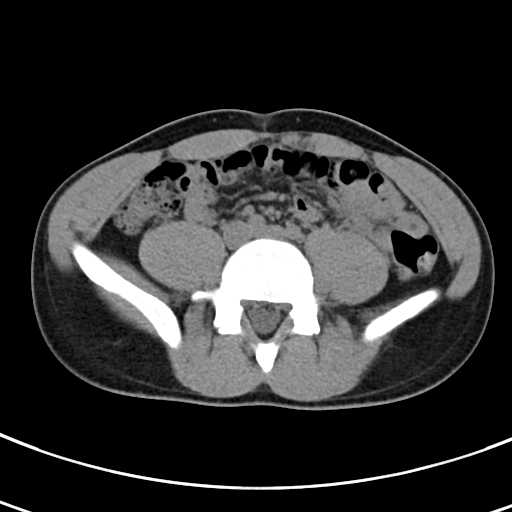
[im 46/78  soft-tissue]
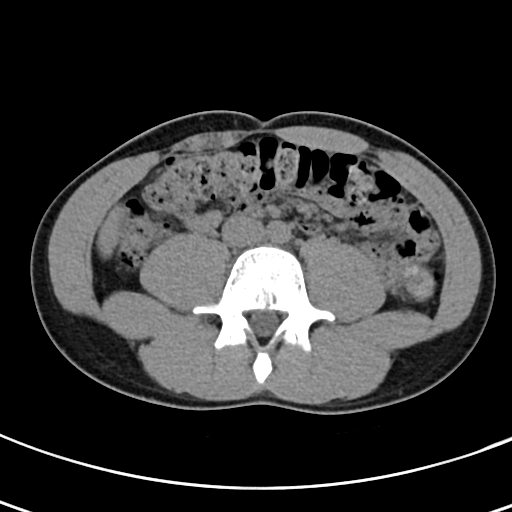
[im 50/78  soft-tissue]
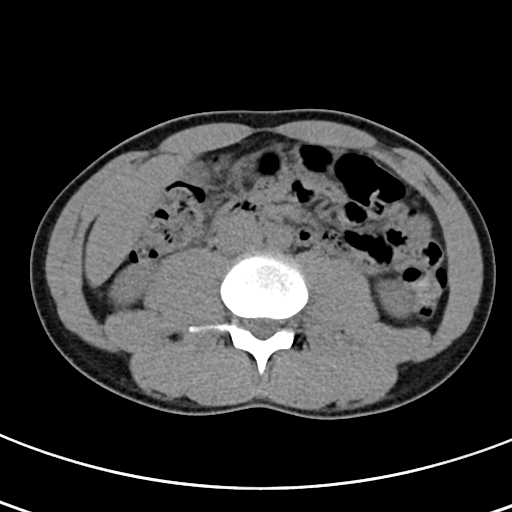
[im 50/78  bone]
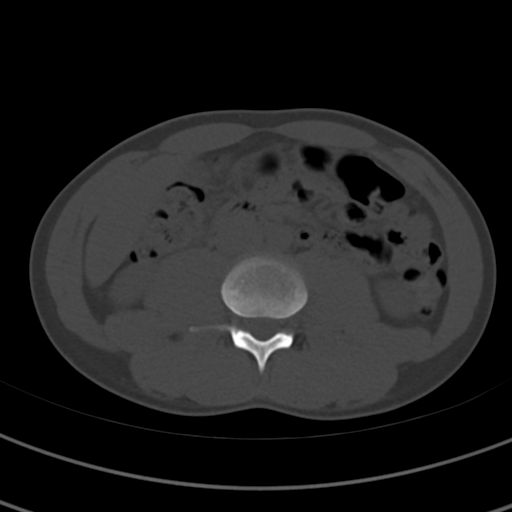
[im 55/78  soft-tissue]
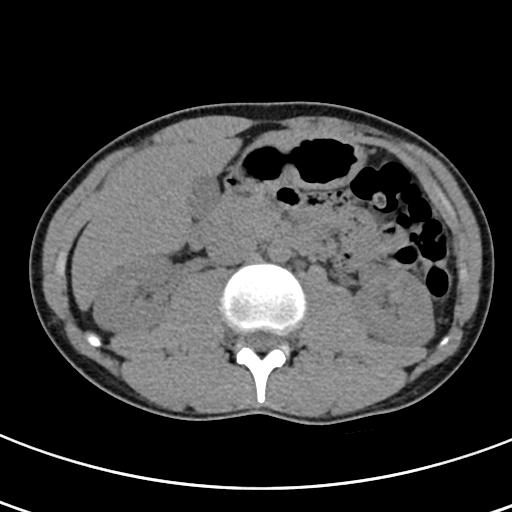
[im 59/78  soft-tissue]
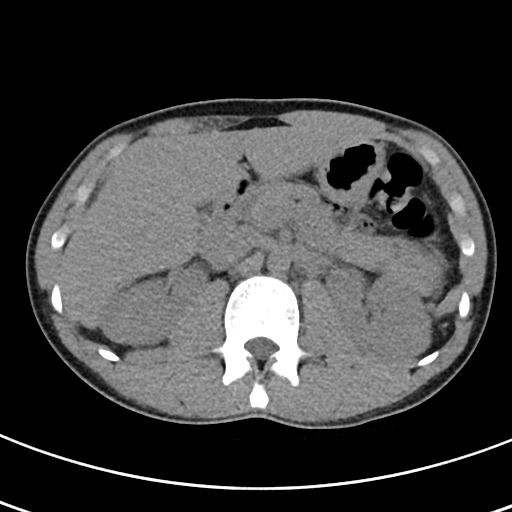
[im 68/78  soft-tissue]
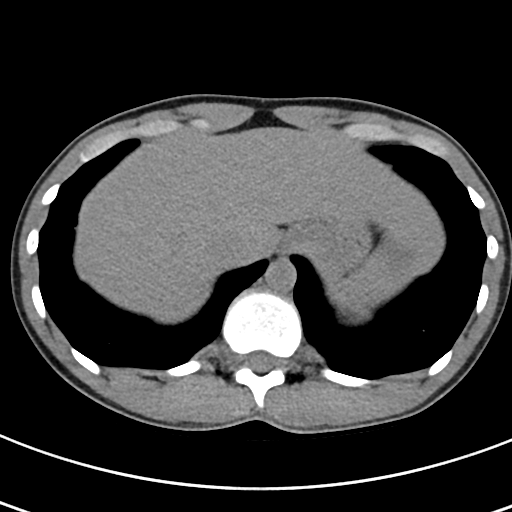
[im 73/78  soft-tissue]
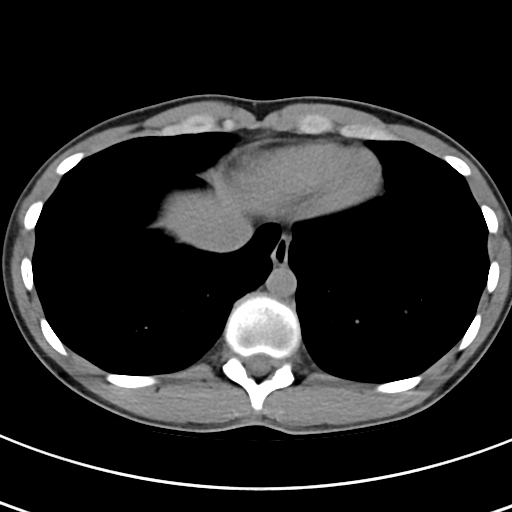

[Series 6: cor · coronal · 0.59mm/px · 3 of 59 slices shown]
[im 20/59  soft-tissue]
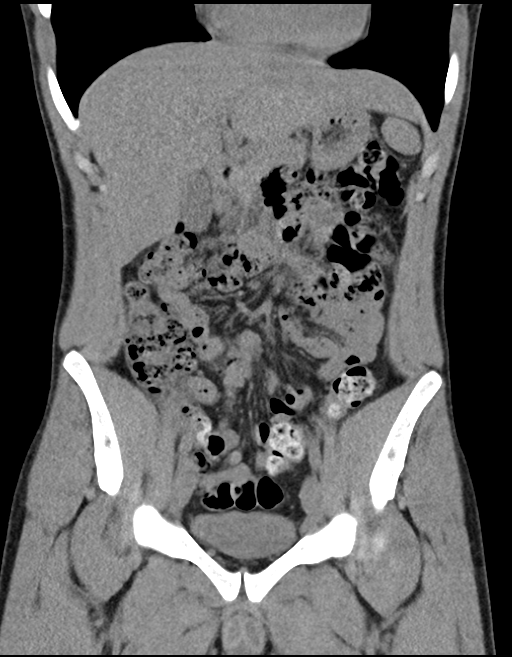
[im 26/59  soft-tissue]
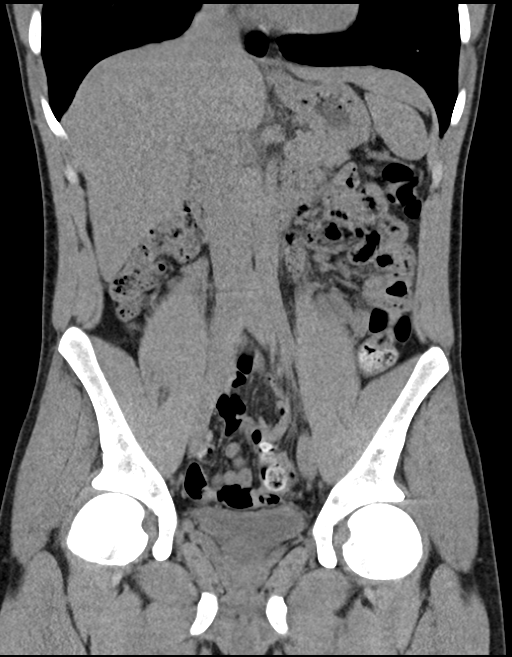
[im 33/59  soft-tissue]
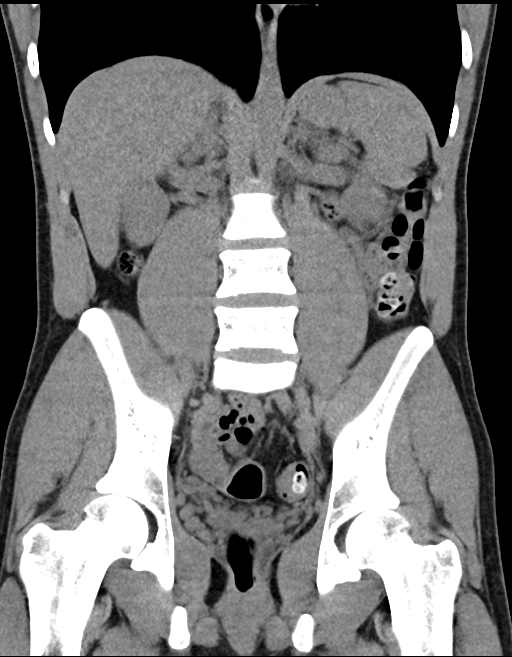

[16 of 46 positions shown; findings below may reference images not displayed]

FINDINGS: Lower chest: No acute findings.

Hepatobiliary: No mass visualized on this unenhanced exam.
Gallbladder is unremarkable.

Pancreas: No mass or inflammatory process visualized on this
unenhanced exam.

Spleen:  Within normal limits in size.

Adrenals/Urinary tract: No evidence of urolithiasis or
hydronephrosis. Unremarkable unopacified urinary bladder.

Stomach/Bowel: No evidence of obstruction, inflammatory process, or
abnormal fluid collections.

Vascular/Lymphatic: No pathologically enlarged lymph nodes
identified. No evidence of abdominal aortic aneurysm.

Reproductive:  No mass or other significant abnormality.

Other:  None.

Musculoskeletal:  No suspicious bone lesions identified.
IMPRESSION: Unremarkable. No evidence of urolithiasis, hydronephrosis, or other
acute findings.

## 2021-03-06 ENCOUNTER — Encounter (HOSPITAL_BASED_OUTPATIENT_CLINIC_OR_DEPARTMENT_OTHER): Payer: Self-pay | Admitting: Emergency Medicine

## 2021-03-06 ENCOUNTER — Other Ambulatory Visit: Payer: Self-pay

## 2021-03-06 ENCOUNTER — Emergency Department (HOSPITAL_BASED_OUTPATIENT_CLINIC_OR_DEPARTMENT_OTHER)
Admission: EM | Admit: 2021-03-06 | Discharge: 2021-03-06 | Disposition: A | Discharge status: Home / Self Care | Payer: Medicaid Other | Attending: Emergency Medicine | Admitting: Emergency Medicine

## 2021-03-06 DIAGNOSIS — W500XXA Accidental hit or strike by another person, initial encounter: Secondary | ICD-10-CM | POA: Insufficient documentation

## 2021-03-06 DIAGNOSIS — S0990XA Unspecified injury of head, initial encounter: Secondary | ICD-10-CM

## 2021-03-06 DIAGNOSIS — Z7722 Contact with and (suspected) exposure to environmental tobacco smoke (acute) (chronic): Secondary | ICD-10-CM | POA: Insufficient documentation

## 2021-03-06 DIAGNOSIS — S0181XA Laceration without foreign body of other part of head, initial encounter: Secondary | ICD-10-CM

## 2021-03-06 DIAGNOSIS — Y9367 Activity, basketball: Secondary | ICD-10-CM | POA: Insufficient documentation

## 2021-03-06 DIAGNOSIS — S01111A Laceration without foreign body of right eyelid and periocular area, initial encounter: Secondary | ICD-10-CM | POA: Insufficient documentation

## 2021-03-06 NOTE — Discharge Instructions (Addendum)
The wound closure can get taken off in a week or 2.

## 2021-03-06 NOTE — ED Triage Notes (Signed)
  Patient BIB EMS with laceration above right eye.  Patient was playing basketball and was elbowed in the face going for the ball.  Patient has 1 inch laceration above R eyebrow.  Bleeding controlled at this time.  No LOC or dizziness.  Pain 2/10 at this time.  Dull headache.

## 2021-03-07 NOTE — ED Provider Notes (Signed)
[facility] EMERGENCY DEPT Provider Note   CSN: [id number] Arrival date & time: 03/06/21  2032     History Chief Complaint  Patient presents with   Laceration    Troy Marshall is a 25 y.o. male.  The history is provided by the patient.  Laceration Patient presents with laceration to forehead.  Got hit with elbow up playing basketball.  No loss conscious.  No numbness weakness.  Does have a mild headache.  No neck pain.  Not on blood thinners.    Past Medical History:  Diagnosis Date   Migraines    Shoulder dislocation    left    Patient Active Problem List   Diagnosis Date Noted   Migraine with aura and with status migrainosus, not intractable 08/09/2015   Migraine without aura and without status migrainosus, not intractable 08/07/2015   Complicated migraine 08/07/2015   Cephalalgia 07/31/2015   Abnormal neurological exam 07/31/2015   Acne 05/25/2013    History reviewed. No pertinent surgical history.     Family History  Problem Relation Age of Onset   Hypertension Other     Social History   Tobacco Use   Smoking status: Passive Smoke Exposure - Never Smoker   Smokeless tobacco: Never  Substance Use Topics   Alcohol use: No    Alcohol/week: 0.0 standard drinks   Drug use: No    Home Medications Prior to Admission medications   Medication Sig Start Date End Date Taking? Authorizing Provider  predniSONE (STERAPRED UNI-PAK 21 TAB) 10 MG (21) TBPK tablet 6 tabs for 1 day, then 5 tabs for 1 das, then 4 tabs for 1 day, then 3 tabs for 1 day, 2 tabs for 1 day, then 1 tab for 1 day 12/04/19   Dahlia Byes A, NP  topiramate (TOPAMAX) 25 MG tablet Take 2 tablets at nighttime Patient not taking: Reported on 03/07/2019 11/27/15 12/04/19  Deetta Perla, MD    Allergies    Patient has no known allergies.  Review of Systems   Review of Systems  Constitutional:  Negative for activity change.  Respiratory:  Negative for shortness of breath.    Cardiovascular:  Negative for chest pain.  Gastrointestinal:  Negative for abdominal pain.  Musculoskeletal:  Negative for back pain.  Skin:  Positive for wound.  Neurological:  Positive for headaches.   Physical Exam Updated Vital Signs BP (!) 129/92 (BP Location: Right Arm)   Pulse 74   Temp 98.8 F (37.1 C) (Oral)   Resp 16   Ht 5\' 7"  (1.702 m)   Wt 54.4 kg   SpO2 100%   BMI 18.79 kg/m   Physical Exam Vitals and nursing note reviewed.  HENT:     Head:     Comments: 2 and half centimeter vertical laceration over right eyebrow.    Mouth/Throat:     Mouth: Mucous membranes are moist.  Eyes:     Extraocular Movements: Extraocular movements intact.     Pupils: Pupils are equal, round, and reactive to light.  Cardiovascular:     Rate and Rhythm: Regular rhythm.  Musculoskeletal:     Cervical back: Neck supple.  Skin:    General: Skin is warm.  Neurological:     General: No focal deficit present.     Mental Status: He is alert and oriented to person, place, and time.    ED Results / Procedures / Treatments   Labs (all labs ordered are listed, but only abnormal results are displayed)  Labs Reviewed - No data to display  EKG None  Radiology No results found.  Procedures Procedures   Medications Ordered in ED Medications - No data to display  ED Course  I have reviewed the triage vital signs and the nursing notes.  Pertinent labs & imaging results that were available during my care of the patient were reviewed by me and considered in my medical decision making (see chart for details).    MDM Rules/Calculators/A&P                          Patient with elbow to face that caused laceration.  Doubt severe intracranial injury, however patient was not amenable to having the wound sutured closed.  I thought this would be most cosmetically successful with facial laceration but patient states he could not handle the needles.  After some discussion and with the type  of wound wound closure devices were placed which appeared approximate rather well.  Discharge home and wound hopefully will heal well. Final Clinical Impression(s) / ED Diagnoses Final diagnoses:  Face lacerations, initial encounter  Minor head injury, initial encounter    Rx / DC Orders ED Discharge Orders     None        Benjiman Core, MD 03/07/21 2348

## 2021-06-12 ENCOUNTER — Emergency Department (HOSPITAL_COMMUNITY)
Admission: EM | Admit: 2021-06-12 | Discharge: 2021-06-12 | Disposition: A | Discharge status: Home / Self Care | Payer: Medicaid Other | Attending: Student | Admitting: Student

## 2021-06-12 DIAGNOSIS — Z5321 Procedure and treatment not carried out due to patient leaving prior to being seen by health care provider: Secondary | ICD-10-CM | POA: Insufficient documentation

## 2021-06-12 DIAGNOSIS — R1030 Lower abdominal pain, unspecified: Secondary | ICD-10-CM | POA: Insufficient documentation

## 2021-06-12 DIAGNOSIS — N4889 Other specified disorders of penis: Secondary | ICD-10-CM | POA: Insufficient documentation

## 2021-06-12 DIAGNOSIS — R300 Dysuria: Secondary | ICD-10-CM | POA: Insufficient documentation

## 2021-06-12 LAB — URINALYSIS, COMPLETE (UACMP) WITH MICROSCOPIC
Bacteria, UA: NONE SEEN
Bilirubin Urine: NEGATIVE
Glucose, UA: NEGATIVE mg/dL
Ketones, ur: NEGATIVE mg/dL
Nitrite: NEGATIVE
Protein, ur: NEGATIVE mg/dL
Specific Gravity, Urine: 1.025 (ref 1.005–1.030)
pH: 7 (ref 5.0–8.0)

## 2021-06-12 NOTE — ED Notes (Signed)
Called pt once for rooming

## 2021-06-12 NOTE — ED Notes (Signed)
Called pt 2x

## 2021-06-12 NOTE — ED Provider Notes (Addendum)
Emergency Medicine Provider Triage Evaluation Note  Troy Marshall , a 25 y.o. male  was evaluated in triage.  Pt complains of intermittent sharp pain in the tip of his penis with yellow discharge both which he noticed first last night.  Some lower abdominal pain and discomfort when he urinates intermittently.  Sexually active with women, does not consistently use barrier protection..  Review of Systems  Positive: Penile pain, penile discharge, dysuria, abdominal pain Negative: Nausea, vomiting, fevers, chills, hematuria  Physical Exam  BP 121/83   Pulse 66   Temp 98.6 F (37 C) (Oral)   Resp 14   SpO2 100%  Gen:   Awake, no distress   Resp:  Normal effort  MSK:   Moves extremities without difficulty  Other:  Some mild tenderness palpation in suprapubic area.  No CVA tenderness.  GU exam deferred in triage.  Medical Decision Making  Medically screening exam initiated at 10:29 AM.  Appropriate orders placed.  [name] Fitzner was informed that the remainder of the evaluation will be completed by another provider, this initial triage assessment does not replace that evaluation, and the importance of remaining in the ED until their evaluation is complete.  Patient declined testing for HIV and syphilis.  This chart was dictated using voice recognition software, Dragon. Despite the best efforts of this provider to proofread and correct errors, errors may still occur which can change documentation meaning.    Paris Lore, PA-C 06/12/21 1034    Hiro Vipond, Eugene Gavia, PA-C 06/12/21 1035    Blane Ohara, MD 06/17/21 1513

## 2021-06-12 NOTE — ED Notes (Signed)
Called pt 3x

## 2021-06-12 NOTE — ED Triage Notes (Signed)
Pt. Stated, I started having penile discharge last night and more this morning

## 2021-06-14 ENCOUNTER — Ambulatory Visit (HOSPITAL_COMMUNITY)
Admission: EM | Admit: 2021-06-14 | Discharge: 2021-06-14 | Disposition: A | Discharge status: Home / Self Care | Payer: Medicaid Other | Attending: Urgent Care | Admitting: Urgent Care

## 2021-06-14 ENCOUNTER — Encounter (HOSPITAL_COMMUNITY): Payer: Self-pay | Admitting: Emergency Medicine

## 2021-06-14 ENCOUNTER — Other Ambulatory Visit: Payer: Self-pay

## 2021-06-14 DIAGNOSIS — R369 Urethral discharge, unspecified: Secondary | ICD-10-CM | POA: Insufficient documentation

## 2021-06-14 DIAGNOSIS — N4889 Other specified disorders of penis: Secondary | ICD-10-CM | POA: Insufficient documentation

## 2021-06-14 DIAGNOSIS — Z7251 High risk heterosexual behavior: Secondary | ICD-10-CM | POA: Insufficient documentation

## 2021-06-14 MED ORDER — CEFTRIAXONE SODIUM 500 MG IJ SOLR
500.0000 mg | Freq: Once | INTRAMUSCULAR | Status: AC
  Administered 2021-06-14: 500 mg via INTRAMUSCULAR

## 2021-06-14 MED ORDER — DOXYCYCLINE HYCLATE 100 MG PO CAPS: 100.0000 mg | ORAL_CAPSULE | Freq: Two times a day (BID) | ORAL | 0 refills | Status: DC

## 2021-06-14 MED ORDER — CEFTRIAXONE SODIUM 500 MG IJ SOLR
INTRAMUSCULAR | Status: AC
  Filled 2021-06-14: qty 500

## 2021-06-14 NOTE — ED Provider Notes (Signed)
[facility]   MRN: [id number] DOB: [birth year]  Subjective:   Troy Marshall is a 25 y.o. male presenting for 2 day history of acute onset penile pain, penile discharge. Had unprotected sex with 1 male, did not use condom. Was seen at the ER, left without being seen. Urinalysis was collected, so was STI test but does not seen to be being processed.   No current facility-administered medications for this encounter.  Current Outpatient Medications:    predniSONE (STERAPRED UNI-PAK 21 TAB) 10 MG (21) TBPK tablet, 6 tabs for 1 day, then 5 tabs for 1 das, then 4 tabs for 1 day, then 3 tabs for 1 day, 2 tabs for 1 day, then 1 tab for 1 day, Disp: 21 tablet, Rfl: 0   No Known Allergies  Past Medical History:  Diagnosis Date   Migraines    Shoulder dislocation    left     No past surgical history on file.  Family History  Problem Relation Age of Onset   Hypertension Other     Social History   Tobacco Use   Smoking status: Passive Smoke Exposure - Never Smoker   Smokeless tobacco: Never  Substance Use Topics   Alcohol use: No    Alcohol/week: 0.0 standard drinks   Drug use: No    ROS   Objective:   Vitals: BP 117/77   Pulse 69   Temp 98.2 F (36.8 C) (Oral)   Resp 16   SpO2 100%   Physical Exam Constitutional:      General: He is not in acute distress.    Appearance: Normal appearance. He is well-developed and normal weight. He is not ill-appearing, toxic-appearing or diaphoretic.  HENT:     Head: Normocephalic and atraumatic.     Right Ear: External ear normal.     Left Ear: External ear normal.     Nose: Nose normal.     Mouth/Throat:     Pharynx: Oropharynx is clear.  Eyes:     General: No scleral icterus.       Right eye: No discharge.        Left eye: No discharge.     Extraocular Movements: Extraocular movements intact.     Pupils: Pupils are equal, round, and reactive to light.  Cardiovascular:     Rate and Rhythm: Normal  rate.  Pulmonary:     Effort: Pulmonary effort is normal.  Genitourinary:    Penis: Uncircumcised. Discharge (copious) present. No phimosis, paraphimosis, hypospadias, erythema, tenderness, swelling or lesions.   Musculoskeletal:     Cervical back: Normal range of motion.  Neurological:     Mental Status: He is alert and oriented to person, place, and time.  Psychiatric:        Mood and Affect: Mood normal.        Behavior: Behavior normal.        Thought Content: Thought content normal.        Judgment: Judgment normal.    Recent Results (from the past 2160 hour(s))  Urinalysis, Complete w Microscopic Urine, Clean Catch     Status: Abnormal   Collection Time: 06/12/21 10:53 AM  Result Value Ref Range   Color, Urine YELLOW YELLOW   APPearance CLOUDY (A) CLEAR   Specific Gravity, Urine 1.025 1.005 - 1.030   pH 7.0 5.0 - 8.0   Glucose, UA NEGATIVE NEGATIVE mg/dL   Hgb urine dipstick TRACE (A) NEGATIVE   Bilirubin Urine NEGATIVE NEGATIVE  Ketones, ur NEGATIVE NEGATIVE mg/dL   Protein, ur NEGATIVE NEGATIVE mg/dL   Nitrite NEGATIVE NEGATIVE   Leukocytes,Ua LARGE (A) NEGATIVE   Squamous Epithelial / LPF 0-5 0 - 5   WBC, UA 21-50 0 - 5 WBC/hpf   RBC / HPF 6-10 0 - 5 RBC/hpf   Bacteria, UA NONE SEEN NONE SEEN   Amorphous Crystal PRESENT     Comment: Performed at [facility], [address] [place], [place] [address]     Assessment and Plan :   PDMP not reviewed this encounter.  1. Penile discharge   2. Unprotected sex   3. Penile pain     Patient treated empirically as per CDC guidelines with IM ceftriaxone, doxycycline as an outpatient.  Labs pending.   Counseled on safe sex practices including abstaining for 1 week following treatment.  Counseled patient on potential for adverse effects with medications prescribed/recommended today, ER and return-to-clinic precautions discussed, patient verbalized understanding.    Wallis Bamberg, PA-C 06/14/21 1644

## 2021-06-14 NOTE — Discharge Instructions (Signed)
Avoid all forms of sexual intercourse (oral, vaginal, anal) for the next 7 days to avoid spreading/reinfecting or at least until we can see what kinds of infection results are positive. Return if symptoms worsen/do not resolve, you develop fever, abdominal pain, blood in your urine, or are re-exposed to a sexually transmitted infection (STI).  

## 2021-06-14 NOTE — ED Triage Notes (Signed)
PT reports penile discharge and irritation for 2 days. He went to ED, but left due to wait, was never tested.

## 2021-06-15 LAB — CYTOLOGY, (ORAL, ANAL, URETHRAL) ANCILLARY ONLY
Chlamydia: NEGATIVE
Comment: NEGATIVE
Comment: NEGATIVE
Comment: NORMAL
Neisseria Gonorrhea: POSITIVE — AB
Trichomonas: NEGATIVE

## 2021-06-15 LAB — GC/CHLAMYDIA PROBE AMP (~~LOC~~) NOT AT ARMC
Chlamydia: NEGATIVE
Comment: NEGATIVE
Comment: NORMAL
Neisseria Gonorrhea: POSITIVE — AB

## 2021-06-16 ENCOUNTER — Telehealth (HOSPITAL_COMMUNITY): Payer: Self-pay | Admitting: Emergency Medicine

## 2021-06-16 NOTE — Telephone Encounter (Signed)
Opened in error

## 2022-09-28 ENCOUNTER — Ambulatory Visit (HOSPITAL_COMMUNITY)
Admission: EM | Admit: 2022-09-28 | Discharge: 2022-09-28 | Disposition: A | Discharge status: Home / Self Care | Payer: Self-pay | Attending: Internal Medicine | Admitting: Internal Medicine

## 2022-09-28 DIAGNOSIS — Z202 Contact with and (suspected) exposure to infections with a predominantly sexual mode of transmission: Secondary | ICD-10-CM | POA: Insufficient documentation

## 2022-09-28 NOTE — Discharge Instructions (Signed)

## 2022-09-28 NOTE — ED Provider Notes (Signed)
[facility]    CSN: [id number] Arrival date & time: 09/28/22  1435      History   Chief Complaint No chief complaint on file.   HPI Troy Marshall is a 27 y.o. male.   Patient presents to urgent care for routine STD check.  Denies recent known exposure to STD.  Does report new partner recently with both unprotected and protected intercourse. He is asymptomatic and denies recent antibiotic use. Denies history of HSV-2.  No further complaints.     No past medical history on file.  There are no problems to display for this patient.      Home Medications    Prior to Admission medications   Not on File    Family History No family history on file.  Social History     Allergies   Patient has no allergy information on record.   Review of Systems Review of Systems Per HPI  Physical Exam Triage Vital Signs ED Triage Vitals [09/28/22 1601]  Enc Vitals Group     BP 117/78     Pulse Rate 71     Resp 16     Temp 98 F (36.7 C)     Temp Source Oral     SpO2 97 %     Weight      Height      Head Circumference      Peak Flow      Pain Score      Pain Loc      Pain Edu?      Excl. in [place]?    No data found.  Updated Vital Signs BP 117/78 (BP Location: Left Arm)   Pulse 71   Temp 98 F (36.7 C) (Oral)   Resp 16   SpO2 97%   Visual Acuity Right Eye Distance:   Left Eye Distance:   Bilateral Distance:    Right Eye Near:   Left Eye Near:    Bilateral Near:     Physical Exam Vitals and nursing note reviewed.  Constitutional:      Appearance: He is not ill-appearing or toxic-appearing.  HENT:     Head: Normocephalic and atraumatic.     Right Ear: Hearing and external ear normal.     Left Ear: Hearing and external ear normal.     Nose: Nose normal.     Mouth/Throat:     Lips: Pink.  Eyes:     General: Lids are normal. Vision grossly intact. Gaze aligned appropriately.     Extraocular Movements: Extraocular movements  intact.     Conjunctiva/sclera: Conjunctivae normal.  Pulmonary:     Effort: Pulmonary effort is normal.  Genitourinary:    Comments: Deferred. Musculoskeletal:     Cervical back: Neck supple.  Skin:    General: Skin is warm and dry.     Capillary Refill: Capillary refill takes less than 2 seconds.     Findings: No rash.  Neurological:     General: No focal deficit present.     Mental Status: He is alert and oriented to person, place, and time. Mental status is at baseline.     Cranial Nerves: No dysarthria or facial asymmetry.  Psychiatric:        Mood and Affect: Mood normal.        Speech: Speech normal.        Behavior: Behavior normal.        Thought Content: Thought content normal.  Judgment: Judgment normal.      UC Treatments / Results  Labs (all labs ordered are listed, but only abnormal results are displayed) Labs Reviewed  CYTOLOGY, (ORAL, ANAL, URETHRAL) ANCILLARY ONLY    EKG   Radiology No results found.  Procedures Procedures (including critical care time)  Medications Ordered in UC Medications - No data to display  Initial Impression / Assessment and Plan / UC Course  I have reviewed the triage vital signs and the nursing notes.  Pertinent labs & imaging results that were available during my care of the patient were reviewed by me and considered in my medical decision making (see chart for details).   1. Possible exposure to STD STI labs pending.  Patient declines HIV and syphilis testing today.  Will notify patient of positive results and treat accordingly when labs come back.  Patient to avoid sexual intercourse until screening testing comes back.  Education provided regarding safe sexual practices and patient encouraged to use protection to prevent spread of STIs.   Discussed physical exam and available lab work findings in clinic with patient.  Counseled patient regarding appropriate use of medications and potential side effects for all  medications recommended or prescribed today. Discussed red flag signs and symptoms of worsening condition,when to call the PCP office, return to urgent care, and when to seek higher level of care in the emergency department. Patient verbalizes understanding and agreement with plan. All questions answered. Patient discharged in stable condition.    Final Clinical Impressions(s) / UC Diagnoses   Final diagnoses:  Possible exposure to STD     Discharge Instructions      Your STD testing has been sent to the lab and will come back in the next 2 to 3 days.  We will call you if any of your results are positive requiring treatment and treat you at that time.   If you do not receive a phone call from [place], this means your testing was negative.  Avoid sexual intercourse until your STD results come back.  If any of your STD results are positive, you will need to avoid sexual intercourse for 7 days while you are being treated to prevent spread of STD.  Condom use is the best way to prevent spread of STDs.  Return to urgent care as needed.    ED Prescriptions   None    PDMP not reviewed this encounter.   Talbot Grumbling, [place] 09/28/22 [phone]

## 2022-09-28 NOTE — ED Triage Notes (Signed)
Patient presents for STD "check up".  Denies any symptoms

## 2022-09-29 LAB — CYTOLOGY, (ORAL, ANAL, URETHRAL) ANCILLARY ONLY
Chlamydia: NEGATIVE
Comment: NEGATIVE
Comment: NEGATIVE
Comment: NORMAL
Neisseria Gonorrhea: POSITIVE — AB
Trichomonas: NEGATIVE

## 2022-09-30 ENCOUNTER — Telehealth (HOSPITAL_COMMUNITY): Payer: Self-pay | Admitting: Emergency Medicine

## 2022-09-30 NOTE — Telephone Encounter (Signed)
Per protocol, patient will need treatment with IM Rocephin 500mg  for positive Gonorrhea

## 2022-10-13 ENCOUNTER — Ambulatory Visit (HOSPITAL_COMMUNITY)
Admission: EM | Admit: 2022-10-13 | Discharge: 2022-10-13 | Disposition: A | Discharge status: Home / Self Care | Payer: Self-pay | Attending: Internal Medicine | Admitting: Internal Medicine

## 2022-10-13 ENCOUNTER — Encounter (HOSPITAL_COMMUNITY): Payer: Self-pay

## 2022-10-13 DIAGNOSIS — A549 Gonococcal infection, unspecified: Secondary | ICD-10-CM

## 2022-10-13 MED ORDER — LIDOCAINE HCL (PF) 1 % IJ SOLN
INTRAMUSCULAR | Status: AC
  Filled 2022-10-13: qty 2

## 2022-10-13 MED ORDER — CEFTRIAXONE SODIUM 500 MG IJ SOLR
500.0000 mg | Freq: Once | INTRAMUSCULAR | Status: AC
  Administered 2022-10-13: 500 mg via INTRAMUSCULAR

## 2022-10-13 MED ORDER — CEFTRIAXONE SODIUM 500 MG IJ SOLR
INTRAMUSCULAR | Status: AC
  Filled 2022-10-13: qty 500

## 2022-10-13 NOTE — ED Triage Notes (Signed)
Patient states that he was called and told to come to  the UC for gonorrhea treatment.

## 2022-10-13 NOTE — ED Notes (Signed)
Patient given Rocephin injection for treatment of STD. Patient tolerated well.

## 2023-02-20 ENCOUNTER — Ambulatory Visit (HOSPITAL_COMMUNITY)
Admission: EM | Admit: 2023-02-20 | Discharge: 2023-02-20 | Disposition: A | Discharge status: Home / Self Care | Payer: Medicaid Other | Attending: Internal Medicine | Admitting: Internal Medicine

## 2023-02-20 ENCOUNTER — Other Ambulatory Visit: Payer: Self-pay

## 2023-02-20 ENCOUNTER — Encounter (HOSPITAL_COMMUNITY): Payer: Self-pay | Admitting: *Deleted

## 2023-02-20 DIAGNOSIS — K649 Unspecified hemorrhoids: Secondary | ICD-10-CM

## 2023-02-20 DIAGNOSIS — B356 Tinea cruris: Secondary | ICD-10-CM

## 2023-02-20 DIAGNOSIS — K5909 Other constipation: Secondary | ICD-10-CM

## 2023-02-20 DIAGNOSIS — Z9189 Other specified personal risk factors, not elsewhere classified: Secondary | ICD-10-CM | POA: Insufficient documentation

## 2023-02-20 MED ORDER — POLYETHYLENE GLYCOL 3350 17 GM/SCOOP PO POWD: 1.0000 | Freq: Once | ORAL | 0 refills | Status: AC

## 2023-02-20 MED ORDER — HYDROCORTISONE 1 % EX CREA: TOPICAL_CREAM | CUTANEOUS | 0 refills | Status: DC

## 2023-02-20 MED ORDER — CLOTRIMAZOLE 1 % EX CREA: TOPICAL_CREAM | CUTANEOUS | 0 refills | Status: DC

## 2023-02-20 NOTE — Discharge Instructions (Addendum)
You are likely constipated.  This is what is causing your hemorrhoid. Apply hemorrhoid cream to the rectum twice daily for the next 7 days.  Apply clotrimazole cream to the perineum/groin area twice daily for the next 7 days as well to treat possible fungal infection to the perineum.  Wear cotton underwear to avoid excessive moisture buildup to the area.  Start taking MiraLAX twice daily until you are able to have a soft normal bowel movement.  Once you are able to have a soft normal bowel movement, decrease the MiraLAX to once daily for the next 3 days then use as needed.  Increase the amount of fiber you are eating by eating more fruits, vegetables, and whole grains.  Increase your water intake to at least 8 cups of water per day to maintain good hydration.  Take Colace stool softener daily to prevent constipation in the future. You may purchase colace over the counter and take this once daily to help keep stools soft.   If you have not had a bowel movement in the next 2 to 3 days, please return to urgent care.  If you develop any new or worsening symptoms that are severe, please go to the emergency room for further evaluation.  I hope you feel better!

## 2023-02-20 NOTE — ED Triage Notes (Signed)
Pt reports he has irritation when he has a BM and irritation in groin area for 2 days.

## 2023-02-20 NOTE — ED Provider Notes (Signed)
[facility]    CSN: [id number] Arrival date & time: 02/20/23  1210      History   Chief Complaint Chief Complaint  Patient presents with   SEXUALLY TRANSMITTED DISEASE    HPI Troy Marshall is a 27 y.o. male.   Patient presents to urgent care for evaluation of irritation to the rectal and penile area that started 2 days ago. Reports itching to both rectal and penile areas. Denies redness, rash, penile discharge, and dysuria/other urinary symptoms. States sometimes he sees blood on the toilet paper when wiping after stooling. Bowel movements are sometimes soft and sometimes hard. Last BM was 2 days ago. States he "tried to go today but couldn't". Reports one recent new unprotected sexual partner, would like STD testing. No recent known exposures to STDs. Denies nausea, vomiting, abdominal pain, fevers, chills, dizziness, headaches, and lightheadedness. He has not tried any OTC medicines to help with symptoms.      Past Medical History:  Diagnosis Date   Migraines    Shoulder dislocation    left    Patient Active Problem List   Diagnosis Date Noted   Migraine with aura and with status migrainosus, not intractable 08/09/2015   Migraine without aura and without status migrainosus, not intractable 08/07/2015   Complicated migraine 08/07/2015   Cephalalgia 07/31/2015   Abnormal neurological exam 07/31/2015   Acne 05/25/2013    History reviewed. No pertinent surgical history.     Home Medications    Prior to Admission medications   Medication Sig Start Date End Date Taking? Authorizing Provider  clotrimazole (LOTRIMIN) 1 % cream Apply to affected area 2 times daily 02/20/23  Yes Carlisle Beers, FNP  hydrocortisone cream 1 % Apply to affected area 2 times daily 02/20/23  Yes Kalin Amrhein, Donavan Burnet, FNP  polyethylene glycol powder (GLYCOLAX/MIRALAX) 17 GM/SCOOP powder Take 255 g by mouth once for 1 dose. 02/20/23 02/20/23 Yes Destenie Ingber, Donavan Burnet,  FNP  doxycycline (VIBRAMYCIN) 100 MG capsule Take 1 capsule (100 mg total) by mouth 2 (two) times daily. 06/14/21   Wallis Bamberg, PA-C  predniSONE (STERAPRED UNI-PAK 21 TAB) 10 MG (21) TBPK tablet 6 tabs for 1 day, then 5 tabs for 1 das, then 4 tabs for 1 day, then 3 tabs for 1 day, 2 tabs for 1 day, then 1 tab for 1 day 12/04/19   Dahlia Byes A, NP  topiramate (TOPAMAX) 25 MG tablet Take 2 tablets at nighttime Patient not taking: Reported on 03/07/2019 11/27/15 12/04/19  Deetta Perla, MD    Family History Family History  Problem Relation Age of Onset   Hypertension Other     Social History Social History   Tobacco Use   Smoking status: Never    Passive exposure: Yes   Smokeless tobacco: Never  Vaping Use   Vaping Use: Never used  Substance Use Topics   Alcohol use: No    Alcohol/week: 0.0 standard drinks of alcohol   Drug use: No     Allergies   Patient has no known allergies.   Review of Systems Review of Systems Per HPI  Physical Exam Triage Vital Signs ED Triage Vitals  Enc Vitals Group     BP 02/20/23 1237 111/76     Pulse Rate 02/20/23 1237 78     Resp 02/20/23 1237 18     Temp 02/20/23 1237 99.1 F (37.3 C)     Temp src --      SpO2 02/20/23 1237 98 %  Weight --      Height --      Head Circumference --      Peak Flow --      Pain Score 02/20/23 1236 0     Pain Loc --      Pain Edu? --      Excl. in [place]? --    No data found.  Updated Vital Signs BP 111/76   Pulse 78   Temp 99.1 F (37.3 C)   Resp 18   SpO2 98%   Visual Acuity Right Eye Distance:   Left Eye Distance:   Bilateral Distance:    Right Eye Near:   Left Eye Near:    Bilateral Near:     Physical Exam Vitals and nursing note reviewed. Exam conducted with a chaperone present Hulan Saas, CMA).  Constitutional:      Appearance: He is not ill-appearing or toxic-appearing.  HENT:     Head: Normocephalic and atraumatic.     Right Ear: Hearing and external ear normal.     Left  Ear: Hearing and external ear normal.     Nose: Nose normal.     Mouth/Throat:     Lips: Pink.  Eyes:     General: Lids are normal. Vision grossly intact. Gaze aligned appropriately.     Extraocular Movements: Extraocular movements intact.     Conjunctiva/sclera: Conjunctivae normal.  Pulmonary:     Effort: Pulmonary effort is normal.  Genitourinary:    Penis: Normal and uncircumcised. No phimosis, paraphimosis, hypospadias, erythema, tenderness, discharge, swelling or lesions.      Testes: Normal.     Epididymis:     Right: Normal.     Left: Normal.       Comments: Maculopapular rash on an erythematous base present to the generalized perineum without signs of skin excoriation.  No purulent drainage or evidence of folliculitis.  No signs of anal fissure. Musculoskeletal:     Cervical back: Neck supple.  Skin:    General: Skin is warm and dry.     Capillary Refill: Capillary refill takes less than 2 seconds.     Findings: No rash.  Neurological:     General: No focal deficit present.     Mental Status: He is alert and oriented to person, place, and time. Mental status is at baseline.     Cranial Nerves: No dysarthria or facial asymmetry.  Psychiatric:        Mood and Affect: Mood normal.        Speech: Speech normal.        Behavior: Behavior normal.        Thought Content: Thought content normal.        Judgment: Judgment normal.      UC Treatments / Results  Labs (all labs ordered are listed, but only abnormal results are displayed) Labs Reviewed - No data to display  EKG   Radiology No results found.  Procedures Procedures (including critical care time)  Medications Ordered in UC Medications - No data to display  Initial Impression / Assessment and Plan / UC Course  I have reviewed the triage vital signs and the nursing notes.  Pertinent labs & imaging results that were available during my care of the patient were reviewed by me and considered in my  medical decision making (see chart for details).   1.  Tinea cruris Presentation consistent with tinea curious.  Will treat this with clotrimazole cream twice daily for the next 7  days to the perineum.  Advised to wear loose fitting cotton underwear to avoid moisture buildup.   2. Hemorrhoids, constipation Presentation consistent with hemorrhoid due to constipation. Will manage this with Miralax as directed, daily stool softener, and increased water/fiber intake.  Preparation H hemorrhoid cream to the perianal area twice daily for the next 7 days.  Advised to avoid straining on the toilet.  No peritoneal signs to abdominal exam. No indication for imaging of the abdomen, low suspicion for obstruction etiology. Return to clinic or go to ER if no bowel movement production in 24-48 hours.  3. Screening for STD STI labs pending.  Patient declines HIV and syphilis testing today.  Will notify patient of positive results and treat accordingly when labs come back.  Patient to avoid sexual intercourse until screening testing comes back.  Education provided regarding safe sexual practices and patient encouraged to use protection to prevent spread of STIs.   Discussed red flag signs and symptoms of worsening condition,when to call the PCP office, return to urgent care, and when to seek higher level of care in the emergency department. Counseled patient regarding appropriate use of medications and potential side effects for all medications recommended or prescribed today. Patient verbalizes understanding and agreement with plan. Discharged in stable condition.     Final Clinical Impressions(s) / UC Diagnoses   Final diagnoses:  Tinea cruris  Hemorrhoids, unspecified hemorrhoid type  Other constipation     Discharge Instructions      You are likely constipated.  This is what is causing your hemorrhoid. Apply hemorrhoid cream to the rectum twice daily for the next 7 days.  Apply clotrimazole cream to  the perineum/groin area twice daily for the next 7 days as well to treat possible fungal infection to the perineum.  Wear cotton underwear to avoid excessive moisture buildup to the area.  Start taking MiraLAX twice daily until you are able to have a soft normal bowel movement.  Once you are able to have a soft normal bowel movement, decrease the MiraLAX to once daily for the next 3 days then use as needed.  Increase the amount of fiber you are eating by eating more fruits, vegetables, and whole grains.  Increase your water intake to at least 8 cups of water per day to maintain good hydration.  Take Colace stool softener daily to prevent constipation in the future. You may purchase colace over the counter and take this once daily to help keep stools soft.   If you have not had a bowel movement in the next 2 to 3 days, please return to urgent care.  If you develop any new or worsening symptoms that are severe, please go to the emergency room for further evaluation.  I hope you feel better!     ED Prescriptions     Medication Sig Dispense Auth. Provider   hydrocortisone cream 1 % Apply to affected area 2 times daily 15 g Reita May M, FNP   clotrimazole (LOTRIMIN) 1 % cream Apply to affected area 2 times daily 15 g Reita May M, FNP   polyethylene glycol powder (GLYCOLAX/MIRALAX) 17 GM/SCOOP powder Take 255 g by mouth once for 1 dose. 255 g Carlisle Beers, FNP      PDMP not reviewed this encounter.   Carlisle Beers, [place] 02/20/23 1326

## 2023-02-23 ENCOUNTER — Telehealth (HOSPITAL_COMMUNITY): Payer: Self-pay | Admitting: Emergency Medicine

## 2023-02-23 LAB — CYTOLOGY, (ORAL, ANAL, URETHRAL) ANCILLARY ONLY
Chlamydia: POSITIVE — AB
Comment: NEGATIVE
Comment: NEGATIVE
Comment: NORMAL
Neisseria Gonorrhea: NEGATIVE
Trichomonas: POSITIVE — AB

## 2023-02-23 MED ORDER — METRONIDAZOLE 500 MG PO TABS: 2000.0000 mg | ORAL_TABLET | Freq: Once | ORAL | 0 refills | Status: AC

## 2023-02-23 MED ORDER — DOXYCYCLINE HYCLATE 100 MG PO CAPS: 100.0000 mg | ORAL_CAPSULE | Freq: Two times a day (BID) | ORAL | 0 refills | Status: AC

## 2023-06-02 ENCOUNTER — Ambulatory Visit (HOSPITAL_COMMUNITY)
Admission: EM | Admit: 2023-06-02 | Discharge: 2023-06-02 | Disposition: A | Discharge status: Home / Self Care | Payer: Medicaid Other | Attending: Emergency Medicine | Admitting: Emergency Medicine

## 2023-06-02 ENCOUNTER — Encounter (HOSPITAL_COMMUNITY): Payer: Self-pay | Admitting: Emergency Medicine

## 2023-06-02 DIAGNOSIS — L290 Pruritus ani: Secondary | ICD-10-CM | POA: Insufficient documentation

## 2023-06-02 DIAGNOSIS — Z113 Encounter for screening for infections with a predominantly sexual mode of transmission: Secondary | ICD-10-CM | POA: Insufficient documentation

## 2023-06-02 MED ORDER — CLOTRIMAZOLE 1 % EX CREA: TOPICAL_CREAM | CUTANEOUS | 0 refills | Status: AC

## 2023-06-02 NOTE — ED Triage Notes (Signed)
Pt requesting STD testing. Denies known exposure. Reports after urinates will having itching for about 2 days.

## 2023-06-02 NOTE — ED Provider Notes (Signed)
[facility]    CSN: [id number] Arrival date & time: 06/02/23  1328      History   Chief Complaint Chief Complaint  Patient presents with   Pruritis    HPI Troy Marshall is a 27 y.o. male.   Patient presents requesting STD testing.  Denies any known exposures.  Endorses having rectal itching after he urinates x 2 days.  Denies abdominal pain, dysuria, hematuria, penile discharge, and rectal pain.      Past Medical History:  Diagnosis Date   Migraines    Shoulder dislocation    left    Patient Active Problem List   Diagnosis Date Noted   Migraine with aura and with status migrainosus, not intractable 08/09/2015   Migraine without aura and without status migrainosus, not intractable 08/07/2015   Complicated migraine 08/07/2015   Cephalalgia 07/31/2015   Abnormal neurological exam 07/31/2015   Acne 05/25/2013    History reviewed. No pertinent surgical history.     Home Medications    Prior to Admission medications   Medication Sig Start Date End Date Taking? Authorizing Provider  clotrimazole (LOTRIMIN) 1 % cream Apply to rectum 2 times daily 06/02/23   Wynonia Lawman A, NP  topiramate (TOPAMAX) 25 MG tablet Take 2 tablets at nighttime Patient not taking: Reported on 03/07/2019 11/27/15 12/04/19  Deetta Perla, MD    Family History Family History  Problem Relation Age of Onset   Hypertension Other     Social History Social History   Tobacco Use   Smoking status: Never    Passive exposure: Yes   Smokeless tobacco: Never  Vaping Use   Vaping status: Never Used  Substance Use Topics   Alcohol use: No    Alcohol/week: 0.0 standard drinks of alcohol   Drug use: No     Allergies   Patient has no known allergies.   Review of Systems Review of Systems  Gastrointestinal:  Negative for abdominal pain.  Genitourinary:  Negative for difficulty urinating, dysuria, flank pain, frequency, genital sores, hematuria, penile  discharge, penile pain, penile swelling, scrotal swelling, testicular pain and urgency.     Physical Exam Triage Vital Signs ED Triage Vitals  Encounter Vitals Group     BP 06/02/23 1346 101/71     Systolic BP Percentile --      Diastolic BP Percentile --      Pulse Rate 06/02/23 1346 69     Resp 06/02/23 1346 13     Temp 06/02/23 1346 97.9 F (36.6 C)     Temp Source 06/02/23 1346 Oral     SpO2 06/02/23 1346 98 %     Weight --      Height --      Head Circumference --      Peak Flow --      Pain Score 06/02/23 1345 0     Pain Loc --      Pain Education --      Exclude from Growth Chart --    No data found.  Updated Vital Signs BP 101/71 (BP Location: Right Arm)   Pulse 69   Temp 97.9 F (36.6 C) (Oral)   Resp 13   SpO2 98%   Visual Acuity Right Eye Distance:   Left Eye Distance:   Bilateral Distance:    Right Eye Near:   Left Eye Near:    Bilateral Near:     Physical Exam Vitals and nursing note reviewed.  Constitutional:  General: He is awake. He is not in acute distress.    Appearance: Normal appearance. He is well-developed and well-groomed. He is not ill-appearing, toxic-appearing or diaphoretic.  Genitourinary:    Comments: Exam deferred.  Neurological:     Mental Status: He is alert.  Psychiatric:        Behavior: Behavior is cooperative.      UC Treatments / Results  Labs (all labs ordered are listed, but only abnormal results are displayed) Labs Reviewed  CYTOLOGY, (ORAL, ANAL, URETHRAL) ANCILLARY ONLY    EKG   Radiology No results found.  Procedures Procedures (including critical care time)  Medications Ordered in UC Medications - No data to display  Initial Impression / Assessment and Plan / UC Course  I have reviewed the triage vital signs and the nursing notes.  Pertinent labs & imaging results that were available during my care of the patient were reviewed by me and considered in my medical decision making (see chart  for details).     Patient presented requesting STD testing.  Patient endorses rectal itching after he urinates x 2 days.  Denies any other symptoms or known exposures.  Patient states the last time he had rectal itching he was tested and treated for STD and rectal itching subsided.  No significant findings upon assessment.  GU exam deferred.  Rectal exam declined.  Patient performed self swab for STD.  Prescribed Lotrimin for rectal itching.  Discussed follow-up and return precautions. Final Clinical Impressions(s) / UC Diagnoses   Final diagnoses:  Screening for STD (sexually transmitted disease)  Rectal itching     Discharge Instructions      Your results will come back over the next few days and someone will call and adjust treatment as needed.  I have represcribed Lotrimin cream that you can apply to your rectum for itching.  Return here as needed.    ED Prescriptions     Medication Sig Dispense Auth. Provider   clotrimazole (LOTRIMIN) 1 % cream Apply to rectum 2 times daily 15 g Wynonia Lawman A, NP      PDMP not reviewed this encounter.   Wynonia Lawman A, NP 06/02/23 1440

## 2023-06-02 NOTE — Discharge Instructions (Addendum)
Your results will come back over the next few days and someone will call and adjust treatment as needed.  I have represcribed Lotrimin cream that you can apply to your rectum for itching.  Return here as needed.

## 2023-06-06 LAB — CYTOLOGY, (ORAL, ANAL, URETHRAL) ANCILLARY ONLY
Chlamydia: NEGATIVE
Comment: NEGATIVE
Comment: NEGATIVE
Comment: NORMAL
Neisseria Gonorrhea: NEGATIVE
Trichomonas: NEGATIVE

## 2023-07-23 ENCOUNTER — Emergency Department (HOSPITAL_COMMUNITY)
Admission: EM | Admit: 2023-07-23 | Discharge: 2023-07-23 | Disposition: A | Discharge status: Home / Self Care | Payer: Medicaid Other | Attending: Emergency Medicine | Admitting: Emergency Medicine

## 2023-07-23 ENCOUNTER — Other Ambulatory Visit: Payer: Self-pay

## 2023-07-23 DIAGNOSIS — K5900 Constipation, unspecified: Secondary | ICD-10-CM | POA: Insufficient documentation

## 2023-07-23 DIAGNOSIS — K649 Unspecified hemorrhoids: Secondary | ICD-10-CM

## 2023-07-23 DIAGNOSIS — K644 Residual hemorrhoidal skin tags: Secondary | ICD-10-CM | POA: Insufficient documentation

## 2023-07-23 LAB — CBC
HCT: 44.8 % (ref 39.0–52.0)
Hemoglobin: 14.9 g/dL (ref 13.0–17.0)
MCH: 32.3 pg (ref 26.0–34.0)
MCHC: 33.3 g/dL (ref 30.0–36.0)
MCV: 97 fL (ref 80.0–100.0)
Platelets: 176 10*3/uL (ref 150–400)
RBC: 4.62 MIL/uL (ref 4.22–5.81)
RDW: 12 % (ref 11.5–15.5)
WBC: 4.5 10*3/uL (ref 4.0–10.5)
nRBC: 0 % (ref 0.0–0.2)

## 2023-07-23 LAB — COMPREHENSIVE METABOLIC PANEL
ALT: 9 U/L (ref 0–44)
AST: 16 U/L (ref 15–41)
Albumin: 3.7 g/dL (ref 3.5–5.0)
Alkaline Phosphatase: 54 U/L (ref 38–126)
Anion gap: 9 (ref 5–15)
BUN: 10 mg/dL (ref 6–20)
CO2: 23 mmol/L (ref 22–32)
Calcium: 8.8 mg/dL — ABNORMAL LOW (ref 8.9–10.3)
Chloride: 105 mmol/L (ref 98–111)
Creatinine, Ser: 0.96 mg/dL (ref 0.61–1.24)
GFR, Estimated: >60 mL/min (ref >60)
Glucose, Bld: 89 mg/dL (ref 70–99)
Potassium: 4 mmol/L (ref 3.5–5.1)
Sodium: 137 mmol/L (ref 135–145)
Total Bilirubin: 0.7 mg/dL (ref <1.2)
Total Protein: 6.8 g/dL (ref 6.5–8.1)

## 2023-07-23 LAB — TYPE AND SCREEN
ABO/RH(D): A POS
Antibody Screen: NEGATIVE

## 2023-07-23 MED ORDER — HYDROCORTISONE ACETATE 25 MG RE SUPP
25.0000 mg | Freq: Two times a day (BID) | RECTAL | 0 refills | Status: AC
Start: 2023-07-23 — End: ?

## 2023-07-23 MED ORDER — HYDROCORTISONE (PERIANAL) 2.5 % EX CREA
1.0000 | TOPICAL_CREAM | Freq: Two times a day (BID) | CUTANEOUS | 0 refills | Status: AC
Start: 2023-07-23 — End: ?

## 2023-07-23 NOTE — ED Provider Notes (Signed)
[place] EMERGENCY DEPARTMENT AT [facility] Provider Note   CSN: [id number] Arrival date & time: 07/23/23  1057     History  Chief Complaint  Patient presents with   Rectal Bleeding   Rectal Pain    Troy Marshall is a 27 y.o. male.  66-year-old male presents today for concern of rectal pain that has been ongoing for about a week.  He states he started having issue with constipation and straining.  He noticed some blood in his stool last night.  Mostly on wiping.  No abdominal pain, nausea, or melanotic stools.  Denies history of hemorrhoid.  The history is provided by the patient. No language interpreter was used.       Home Medications Prior to Admission medications   Medication Sig Start Date End Date Taking? Authorizing Provider  hydrocortisone (ANUSOL-HC) 2.5 % rectal cream Place 1 Application rectally 2 (two) times daily. 07/23/23  Yes Karie Mainland, Deshondra Worst, PA-C  hydrocortisone (ANUSOL-HC) 25 MG suppository Place 1 suppository (25 mg total) rectally 2 (two) times daily. 07/23/23  Yes Karie Mainland, Shaune Westfall, PA-C  clotrimazole (LOTRIMIN) 1 % cream Apply to rectum 2 times daily 06/02/23   Wynonia Lawman A, NP  topiramate (TOPAMAX) 25 MG tablet Take 2 tablets at nighttime Patient not taking: Reported on 03/07/2019 11/27/15 12/04/19  Deetta Perla, MD      Allergies    Patient has no known allergies.    Review of Systems   Review of Systems  Constitutional:  Negative for chills and fever.  Gastrointestinal:  Positive for blood in stool, constipation, hematochezia and rectal pain. Negative for abdominal pain and anal bleeding.  All other systems reviewed and are negative.   Physical Exam Updated Vital Signs BP 112/72 (BP Location: Right Arm)   Pulse 64   Temp 98.6 F (37 C) (Oral)   Resp 16   SpO2 97%  Physical Exam Vitals and nursing note reviewed.  Constitutional:      General: He is not in acute distress.    Appearance: Normal appearance. He is not  ill-appearing.  HENT:     Head: Normocephalic and atraumatic.     Nose: Nose normal.  Eyes:     Conjunctiva/sclera: Conjunctivae normal.  Pulmonary:     Effort: Pulmonary effort is normal. No respiratory distress.  Genitourinary:    Comments: [name] nurse tech present as Biomedical engineer.  Visible external hemorrhoid noted.  No anal fissure. Musculoskeletal:        General: No deformity.  Skin:    Findings: No rash.  Neurological:     Mental Status: He is alert.     ED Results / Procedures / Treatments   Labs (all labs ordered are listed, but only abnormal results are displayed) Labs Reviewed  COMPREHENSIVE METABOLIC PANEL - Abnormal; Notable for the following components:      Result Value   Calcium 8.8 (*)    All other components within normal limits  CBC  POC OCCULT BLOOD, ED  TYPE AND SCREEN    EKG None  Radiology No results found.  Procedures Procedures    Medications Ordered in ED Medications - No data to display  ED Course/ Medical Decision Making/ A&P                                 Medical Decision Making Amount and/or Complexity of Data Reviewed Labs: ordered.  Risk Prescription drug management.  27 year old male presents today for concern of rectal pain.  On exam he does have a visible external hemorrhoid.  He also endorses straining and constipation.  Discussed importance of bowel regimen.  Anusol prescribed.  GI referral given as well as PCP to establish care with.  Discussed supportive care.  Return precaution discussed.  Hemodynamically stable.  Hemoglobin is stable.  Discharged in stable condition.  Return precaution discussed.   Final Clinical Impression(s) / ED Diagnoses Final diagnoses:  Hemorrhoids, unspecified hemorrhoid type    Rx / DC Orders ED Discharge Orders          Ordered    hydrocortisone (ANUSOL-HC) 25 MG suppository  2 times daily        07/23/23 1311    hydrocortisone (ANUSOL-HC) 2.5 % rectal cream  2 times daily         07/23/23 1311              Marita Kansas, PA-C 07/23/23 1356    Gloris Manchester, MD 07/23/23 1622

## 2023-07-23 NOTE — Discharge Instructions (Signed)
I have given you a referral to gastroenterology.  If this does not get better follow-up with them.  Establish care with an internal medicine center.  I have sent 2 medications in for you to help with the discomfort.  Have also listed information for sitz bath.  For any concerning symptoms return to the emergency room.  I recommend that she take a stool softener such as MiraLAX.  Use 2-3 capfuls a day until you have the desired effect on your bowel movements then you can decrease to 1 capful a day.  This will help with the discomfort.  Drink plenty of fluids.

## 2023-07-23 NOTE — ED Triage Notes (Signed)
Pt. Stated, Troy Marshall had rectal pain, rectal pain for about a week and half . Its like I'm constipated and can't go cause of the pain. Last night was normal but Had bleeding and pain.

## 2023-08-11 ENCOUNTER — Other Ambulatory Visit: Payer: Self-pay

## 2023-08-11 ENCOUNTER — Emergency Department (HOSPITAL_COMMUNITY)
Admission: EM | Admit: 2023-08-11 | Discharge: 2023-08-11 | Discharge status: Against Medical Advice | Payer: Self-pay | Attending: Emergency Medicine | Admitting: Emergency Medicine

## 2023-08-11 ENCOUNTER — Encounter (HOSPITAL_COMMUNITY): Payer: Self-pay

## 2023-08-11 DIAGNOSIS — Z5321 Procedure and treatment not carried out due to patient leaving prior to being seen by health care provider: Secondary | ICD-10-CM | POA: Insufficient documentation

## 2023-08-11 DIAGNOSIS — K0381 Cracked tooth: Secondary | ICD-10-CM | POA: Insufficient documentation

## 2023-08-11 NOTE — ED Notes (Signed)
Pt has not been in room at all.

## 2023-08-11 NOTE — ED Notes (Signed)
Pt was called 5 times no answer

## 2023-08-11 NOTE — ED Provider Notes (Signed)
Patient was electronically tracked back to the room, but was never physically present in the room.    Achille Rich, PA-C 08/11/23 0522    Gilda Crease, MD 08/18/23 [phone]

## 2023-08-11 NOTE — ED Triage Notes (Signed)
Pt reports he cracked one of his left lower teeth x 2 days ago.

## 2023-10-17 ENCOUNTER — Encounter (HOSPITAL_COMMUNITY): Payer: Self-pay

## 2023-10-17 ENCOUNTER — Emergency Department (HOSPITAL_COMMUNITY)
Admission: EM | Admit: 2023-10-17 | Discharge: 2023-10-17 | Discharge status: Against Medical Advice | Payer: Self-pay | Attending: Emergency Medicine | Admitting: Emergency Medicine

## 2023-10-17 ENCOUNTER — Other Ambulatory Visit: Payer: Self-pay

## 2023-10-17 DIAGNOSIS — Z5321 Procedure and treatment not carried out due to patient leaving prior to being seen by health care provider: Secondary | ICD-10-CM | POA: Insufficient documentation

## 2023-10-17 DIAGNOSIS — K0889 Other specified disorders of teeth and supporting structures: Secondary | ICD-10-CM | POA: Insufficient documentation

## 2023-10-17 NOTE — ED Triage Notes (Signed)
Left lower dental pain that started last night. Pt states he has had issues with this tooth before, but never got it removed.

## 2024-01-21 ENCOUNTER — Other Ambulatory Visit: Payer: Self-pay

## 2024-01-21 ENCOUNTER — Emergency Department (HOSPITAL_COMMUNITY)
Admission: EM | Admit: 2024-01-21 | Discharge: 2024-01-21 | Disposition: A | Discharge status: Home / Self Care | Payer: Self-pay | Attending: Emergency Medicine | Admitting: Emergency Medicine

## 2024-01-21 DIAGNOSIS — K029 Dental caries, unspecified: Secondary | ICD-10-CM | POA: Insufficient documentation

## 2024-01-21 MED ORDER — LIDOCAINE VISCOUS HCL 2 % MT SOLN
15.0000 mL | Freq: Once | OROMUCOSAL | Status: AC
  Administered 2024-01-21: 15 mL via OROMUCOSAL
  Filled 2024-01-21: qty 15

## 2024-01-21 MED ORDER — PENICILLIN V POTASSIUM 500 MG PO TABS: 500.0000 mg | ORAL_TABLET | Freq: Four times a day (QID) | ORAL | 0 refills | Status: AC

## 2024-01-21 MED ORDER — NAPROXEN 500 MG PO TABS: 500.0000 mg | ORAL_TABLET | Freq: Two times a day (BID) | ORAL | 0 refills | Status: AC

## 2024-01-21 MED ORDER — PENICILLIN V POTASSIUM 250 MG PO TABS
500.0000 mg | ORAL_TABLET | Freq: Once | ORAL | Status: AC
  Administered 2024-01-21: 500 mg via ORAL
  Filled 2024-01-21: qty 2

## 2024-01-21 MED ORDER — NAPROXEN 250 MG PO TABS
500.0000 mg | ORAL_TABLET | Freq: Once | ORAL | Status: AC
  Administered 2024-01-21: 500 mg via ORAL
  Filled 2024-01-21: qty 2

## 2024-01-21 NOTE — ED Provider Notes (Signed)
 [place] EMERGENCY DEPARTMENT AT [facility] Provider Note   CSN: [id number] Arrival date & time: 01/21/24  0124     History  Chief Complaint  Patient presents with   Dental Pain    Sayid Airron Sandy is a 28 y.o. male.  The history is provided by the patient.  Dental Pain Location:  Lower Lower teeth location:  22/LL cuspid Quality:  Aching Severity:  Severe Onset quality:  Gradual Duration:  1 day Timing:  Constant Progression:  Worsening Chronicity:  New Context: dental fracture and poor dentition   Associated symptoms: no facial swelling and no fever        Home Medications Prior to Admission medications   Medication Sig Start Date End Date Taking? Authorizing Provider  clotrimazole  (LOTRIMIN ) 1 % cream Apply to rectum 2 times daily 06/02/23   Levora Reas A, NP  hydrocortisone  (ANUSOL -HC) 2.5 % rectal cream Place 1 Application rectally 2 (two) times daily. 07/23/23   Lucina Sabal, PA-C  hydrocortisone  (ANUSOL -HC) 25 MG suppository Place 1 suppository (25 mg total) rectally 2 (two) times daily. 07/23/23   Lucina Sabal, PA-C  topiramate  (TOPAMAX ) 25 MG tablet Take 2 tablets at nighttime Patient not taking: Reported on 03/07/2019 11/27/15 12/04/19  Dara Ear, MD      Allergies    Patient has no known allergies.    Review of Systems   Review of Systems  Constitutional:  Negative for fever.  HENT:  Negative for facial swelling.   All other systems reviewed and are negative.   Physical Exam Updated Vital Signs BP 129/79 (BP Location: Right Arm)   Pulse 67   Temp 98.4 F (36.9 C)   Resp 16   SpO2 98%  Physical Exam Vitals and nursing note reviewed.  Constitutional:      General: He is not in acute distress.    Appearance: Normal appearance. He is well-developed. He is not diaphoretic.  HENT:     Head: Normocephalic and atraumatic.     Nose: Nose normal.     Mouth/Throat:   Eyes:     Conjunctiva/sclera: Conjunctivae  normal.     Pupils: Pupils are equal, round, and reactive to light.  Cardiovascular:     Rate and Rhythm: Normal rate and regular rhythm.     Pulses: Normal pulses.     Heart sounds: Normal heart sounds.  Pulmonary:     Effort: Pulmonary effort is normal.     Breath sounds: Normal breath sounds. No wheezing or rales.  Abdominal:     General: Bowel sounds are normal.     Palpations: Abdomen is soft.     Tenderness: There is no abdominal tenderness. There is no guarding or rebound.  Musculoskeletal:        General: Normal range of motion.     Cervical back: Normal range of motion and neck supple.  Skin:    General: Skin is warm and dry.     Capillary Refill: Capillary refill takes less than 2 seconds.  Neurological:     General: No focal deficit present.     Mental Status: He is alert and oriented to person, place, and time.     Deep Tendon Reflexes: Reflexes normal.  Psychiatric:        Mood and Affect: Mood normal.        Behavior: Behavior normal.     ED Results / Procedures / Treatments   Labs (all labs ordered are listed, but only abnormal results  are displayed) Labs Reviewed - No data to display  EKG None  Radiology No results found.  Procedures Procedures    Medications Ordered in ED Medications  penicillin v potassium (VEETID) tablet 500 mg (has no administration in time range)  lidocaine  (XYLOCAINE ) 2 % viscous mouth solution 15 mL (has no administration in time range)  naproxen (NAPROSYN) tablet 500 mg (has no administration in time range)    ED Course/ Medical Decision Making/ A&P                                 Medical Decision Making Dental pain x 1 days   Amount and/or Complexity of Data Reviewed Independent Historian: friend    Details: See above  External Data Reviewed: notes.    Details: Previous notes reviewed   Risk Prescription drug management. Risk Details: Patient with dental caries will need to follow up with dentistry for ongoing  care.     Final Clinical Impression(s) / ED Diagnoses Final diagnoses:  Dental caries  Pain due to dental caries   No signs of systemic illness or infection. The patient is nontoxic-appearing on exam and vital signs are within normal limits.  I have reviewed the triage vital signs and the nursing notes. Pertinent labs & imaging results that were available during my care of the patient were reviewed by me and considered in my medical decision making (see chart for details). After history, exam, and medical workup I feel the patient has been appropriately medically screened and is safe for discharge home. Pertinent diagnoses were discussed with the patient. Patient was given return precautions.  Rx / DC Orders ED Discharge Orders     None         Keilany Burnette, MD 01/21/24 1610

## 2024-01-21 NOTE — ED Triage Notes (Signed)
 Patient reports left lower toothache onset yesterday .
